# Patient Record
Sex: Female | Born: 1980 | Race: Black or African American | Hispanic: No | Marital: Single | Smoking: Never smoker
Health system: Southern US, Community
[De-identification: ages and names within clinical notes are randomized; demographics above are authoritative.]

## PROBLEM LIST (undated history)

## (undated) DIAGNOSIS — I10 Essential (primary) hypertension: Secondary | ICD-10-CM

## (undated) DIAGNOSIS — G43909 Migraine, unspecified, not intractable, without status migrainosus: Secondary | ICD-10-CM

## (undated) DIAGNOSIS — F32A Depression, unspecified: Secondary | ICD-10-CM

## (undated) DIAGNOSIS — E119 Type 2 diabetes mellitus without complications: Secondary | ICD-10-CM

## (undated) DIAGNOSIS — K589 Irritable bowel syndrome without diarrhea: Secondary | ICD-10-CM

## (undated) DIAGNOSIS — M222X9 Patellofemoral disorders, unspecified knee: Secondary | ICD-10-CM

## (undated) DIAGNOSIS — F419 Anxiety disorder, unspecified: Secondary | ICD-10-CM

## (undated) DIAGNOSIS — K8020 Calculus of gallbladder without cholecystitis without obstruction: Secondary | ICD-10-CM

## (undated) DIAGNOSIS — N760 Acute vaginitis: Secondary | ICD-10-CM

## (undated) DIAGNOSIS — B9689 Other specified bacterial agents as the cause of diseases classified elsewhere: Secondary | ICD-10-CM

## (undated) DIAGNOSIS — D649 Anemia, unspecified: Secondary | ICD-10-CM

## (undated) HISTORY — DX: Calculus of gallbladder without cholecystitis without obstruction: K80.20

## (undated) HISTORY — PX: CHOLECYSTECTOMY: SHX55

## (undated) HISTORY — DX: Anxiety disorder, unspecified: F41.9

## (undated) HISTORY — DX: Morbid (severe) obesity due to excess calories: E66.01

## (undated) HISTORY — DX: Migraine, unspecified, not intractable, without status migrainosus: G43.909

## (undated) HISTORY — DX: Essential (primary) hypertension: I10

## (undated) HISTORY — DX: Acute vaginitis: N76.0

## (undated) HISTORY — DX: Type 2 diabetes mellitus without complications: E11.9

## (undated) HISTORY — DX: Depression, unspecified: F32.A

## (undated) HISTORY — DX: Patellofemoral disorders, unspecified knee: M22.2X9

## (undated) HISTORY — DX: Irritable bowel syndrome, unspecified: K58.9

## (undated) HISTORY — DX: Anemia, unspecified: D64.9

## (undated) HISTORY — DX: Other specified bacterial agents as the cause of diseases classified elsewhere: B96.89

---

## 2003-06-12 HISTORY — PX: COLONOSCOPY: SHX174

## 2013-08-11 ENCOUNTER — Encounter (HOSPITAL_COMMUNITY): Payer: Self-pay | Admitting: Family Medicine

## 2013-08-11 ENCOUNTER — Emergency Department (HOSPITAL_COMMUNITY)
Admission: EM | Admit: 2013-08-11 | Discharge: 2013-08-11 | Disposition: A | Discharge status: Home / Self Care | Payer: Medicaid Other | Source: Home / Self Care

## 2013-08-11 DIAGNOSIS — H6990 Unspecified Eustachian tube disorder, unspecified ear: Secondary | ICD-10-CM

## 2013-08-11 DIAGNOSIS — J069 Acute upper respiratory infection, unspecified: Secondary | ICD-10-CM

## 2013-08-11 DIAGNOSIS — H6980 Other specified disorders of Eustachian tube, unspecified ear: Secondary | ICD-10-CM

## 2013-08-11 DIAGNOSIS — B9789 Other viral agents as the cause of diseases classified elsewhere: Principal | ICD-10-CM

## 2013-08-11 LAB — POCT RAPID STREP A: STREPTOCOCCUS, GROUP A SCREEN (DIRECT): NEGATIVE

## 2013-08-11 MED ORDER — IPRATROPIUM BROMIDE 0.06 % NA SOLN: 2.0000 | Freq: Four times a day (QID) | NASAL | Status: DC

## 2013-08-11 MED ORDER — ANTIPYRINE-BENZOCAINE 5.4-1.4 % OT SOLN: 3.0000 [drp] | OTIC | Status: DC | PRN

## 2013-08-11 NOTE — Discharge Instructions (Signed)
You are doing well overall and likely have a viral illness causing your symptoms. If you do not get better in a total of 7-10 days, or if you get worse let us know as you may have developed a secondary bacterial infection Please start on the atrovent, Mucinex D, and ibuprofen 600mg  every 6 hours for your symptoms Please start on the auralgan for the ear pain/fullness.

## 2013-08-11 NOTE — ED Provider Notes (Signed)
CSN: [id number]     Arrival date & time 08/11/13  1843 History   None    No chief complaint on file.  (Consider location/radiation/quality/duration/timing/severity/associated sxs/prior Treatment) HPI  Sore throat: started on Saturday. Partied on Friday. H/o 2-3 lifetime strep throat infections. White spots on tonsils. Associated w/ cough adn runny nose. Denies body aches, fever, n/v/d. Getting better. Has not taken any medications yet. Pain is described as irritation.   L ear pain: started 1 hr ago. Achy pain. Difficulty hearing. Full sensation.   History reviewed. No pertinent past medical history. Past Surgical History  Procedure Laterality Date  . Cholecystectomy     Family History  Problem Relation Age of Onset  . Diabetes Mother   . Hypertension Mother   . Diabetes Father   . Hypertension Father   . Kidney failure Father    History  Substance Use Topics  . Smoking status: Never Smoker   . Smokeless tobacco: Not on file  . Alcohol Use: Yes   OB History   Grav Para Term Preterm Abortions TAB SAB Ect Mult Living                 Review of Systems  Constitutional: Negative for fever, chills and fatigue.  HENT: Positive for congestion.   All other systems reviewed and are negative.    Allergies  Review of patient's allergies indicates not on file.  Home Medications   Current Outpatient Rx  Name  Route  Sig  Dispense  Refill  . antipyrine-benzocaine (AURALGAN) otic solution   Both Ears   Place 3-4 drops into both ears every 2 (two) hours as needed for ear pain.   10 mL   0   . ipratropium (ATROVENT) 0.06 % nasal spray   Each Nare   Place 2 sprays into both nostrils 4 (four) times daily.   15 mL   12    BP 173/78  Pulse 69  Temp(Src) 98.6 F (37 C) (Oral)  Resp 16  SpO2 100% Physical Exam  Constitutional: She is oriented to person, place, and time. She appears well-developed and well-nourished. No distress.  HENT:  TM bilat w/ clear effusions   Eyes: EOM are normal. Pupils are equal, round, and reactive to light.  Neck: Normal range of motion. Neck supple.  Cardiovascular: Normal rate, regular rhythm, normal heart sounds and intact distal pulses.  Exam reveals no gallop and no friction rub.   No murmur heard. Pulmonary/Chest: Effort normal and breath sounds normal. No respiratory distress. She has no wheezes. She has no rales. She exhibits no tenderness.  Abdominal: Soft. She exhibits no distension.  Musculoskeletal: Normal range of motion. She exhibits no edema and no tenderness.  Lymphadenopathy:    She has no cervical adenopathy.  Neurological: She is alert and oriented to person, place, and time.  Skin: Skin is warm. No rash noted. She is not diaphoretic.  Psychiatric: Her behavior is normal. Judgment and thought content normal.    ED Course  Procedures (including critical care time) Labs Review Labs Reviewed  POCT RAPID STREP A ([facility] CARE ONLY)   Imaging Review No results found.   MDM   1. Viral URI with cough   2. Eustachian tube dysfunction    33yo F w/ lkely viral URI and eustachian tube dysfunction.  - auralgan for ear fullness. Also anticipate improvement w/ NSAID use.  - consider flonase if not improving - start atrovent, mucinex D, and Ibuprofen 600mg  every 6 hours -  precautions given and all questions answered - pt to return if not improveing or worsening after a total of 7-10 days of illness  Heidi Flatten, MD Family Medicine PGY-3 08/11/2013, 9:32 PM      Heidi Rocks, MD 08/11/13 2132

## 2013-08-11 NOTE — ED Notes (Signed)
Patient complains of sore throat that started 3 days ago.

## 2013-08-11 NOTE — ED Provider Notes (Signed)
Medical screening examination/treatment/procedure(s) were performed by a resident physician and as supervising physician I was immediately available for consultation/collaboration.  Leslee Homeavid Cystal Shannahan, M.D.  Reuben Likesavid C Nekisha Mcdiarmid, MD 08/11/13 [phone]

## 2013-08-13 LAB — CULTURE, GROUP A STREP

## 2014-06-24 ENCOUNTER — Encounter: Payer: Self-pay | Admitting: Dietician

## 2014-06-24 ENCOUNTER — Encounter: Payer: Medicaid Other | Attending: Family Medicine | Admitting: Dietician

## 2014-06-24 DIAGNOSIS — Z6841 Body Mass Index (BMI) 40.0 and over, adult: Secondary | ICD-10-CM | POA: Insufficient documentation

## 2014-06-24 NOTE — Progress Notes (Signed)
  Medical Nutrition Therapy:  Appt start time: 0930 end time:  1030.  Assessment:  Primary concerns today: Natalia LeatherwoodKatherine is here to discuss weight loss and overall health. She would like to focus on lifestyle changes and not a "diet." Has lost weight in the past by following a strict meal plan and dancing on Wii Fit. She reports that she has a watery bowel movement almost every time she eats. Sometimes has to go the bathroom before she even finishes her meal. Had a colonoscopy and endoscopy years ago and results were normal. In the past has kept a detailed food journal. Her frequent, loose bowel movements have gotten worse since her cholecystectomy. Has noticed that high fat foods and lactose make symptoms worse.  Immodium helps some. This has been happening for over 10 years. She uses public transportation and [employer] benefits. HgbA1c 6%. The family does not fry foods. She shares cooking and grocery shopping responsibilities with family members. Would like to store food for herself and her 662 year old son in the basement in the home where they live. She cares for her ill mother.   Preferred Learning Style:   No preference indicated   Learning Readiness:   Ready   MEDICATIONS: see list   DIETARY INTAKE:   24-hr recall:  B (7:40 AM): Lucky Charms or Captain Crunch cereal with 2% Lactaid milk OR sausage or gravy biscuit from [employer]  Snk ( AM):   L ( PM): Ramen noodles or leftovers  Snk ( PM):  D ( PM): baked ribs, pork chops, ground beef, Malaw[place]key necks, chicken with canned vegetables Snk ( PM):   Beverages: juice, water  Usual physical activity: dancing with Wii  Estimated energy needs: 2000-2200 calories 225-248 g carbohydrates  Progress Towards Goal(s):  In progress.   Nutritional Diagnosis:  Ivor-3.3 Overweight/obesity As related to excessive energy intake, inappropriate food choices, and physical inactivity.  As evidenced by BMI 55 and dietary recall.    Intervention:  Nutrition  counseling provided. Discussed meal and snack ideas on a budget. Encouraged patient to limit carbohydrates and salt. Goals: -Fill up on non starchy vegetables (limit corn, peas, and potatoes) -Try rinsing canned vegetables or use frozen vegetables  -Continue to avoid skipping meals -Have a protein food with every meal and snack -Try making a lot of food ahead of time (leftovers!) -Keep dancing and walking for exercise!!  Teaching Method Utilized:  Visual Auditory  Handouts given during visit include:  Diabetes sample meal plan  Meal planning card  MyPlate  Snack sheet  Barriers to learning/adherence to lifestyle change: limited financial resources   Demonstrated degree of understanding via:  Teach Back   Monitoring/Evaluation:  Dietary intake, exercise, and body weight in 3 month(s).

## 2014-06-24 NOTE — Patient Instructions (Addendum)
-  Fill up on non starchy vegetables (limit corn, peas, and potatoes) -Try rinsing canned vegetables or use frozen vegetables   -Continue to avoid skipping meals -Have a protein food with every meal and snack  -Try making a lot of food ahead of time (leftovers!)  -Keep dancing and walking for exercise!!

## 2014-09-23 ENCOUNTER — Ambulatory Visit: Payer: Medicaid Other | Admitting: *Deleted

## 2014-10-21 ENCOUNTER — Ambulatory Visit: Payer: Medicaid Other | Admitting: *Deleted

## 2014-10-26 ENCOUNTER — Encounter: Payer: Self-pay | Admitting: Gastroenterology

## 2014-12-06 ENCOUNTER — Ambulatory Visit: Payer: Medicaid Other | Admitting: *Deleted

## 2015-01-03 ENCOUNTER — Ambulatory Visit: Payer: Medicaid Other | Admitting: Gastroenterology

## 2015-03-08 ENCOUNTER — Encounter: Payer: Self-pay | Admitting: Gastroenterology

## 2015-03-08 ENCOUNTER — Ambulatory Visit (INDEPENDENT_AMBULATORY_CARE_PROVIDER_SITE_OTHER): Payer: Medicaid Other | Admitting: Gastroenterology

## 2015-03-08 ENCOUNTER — Other Ambulatory Visit (INDEPENDENT_AMBULATORY_CARE_PROVIDER_SITE_OTHER): Payer: Medicaid Other

## 2015-03-08 VITALS — BP 112/72 | HR 80 | Ht 65.0 in | Wt 331.6 lb

## 2015-03-08 DIAGNOSIS — K589 Irritable bowel syndrome without diarrhea: Secondary | ICD-10-CM | POA: Diagnosis not present

## 2015-03-08 DIAGNOSIS — R197 Diarrhea, unspecified: Secondary | ICD-10-CM

## 2015-03-08 LAB — CBC WITH DIFFERENTIAL/PLATELET
BASOS ABS: 0 10*3/uL (ref 0.0–0.1)
Basophils Relative: 0.6 % (ref 0.0–3.0)
Eosinophils Absolute: 0.1 10*3/uL (ref 0.0–0.7)
Eosinophils Relative: 1.8 % (ref 0.0–5.0)
HCT: 35.2 % — ABNORMAL LOW (ref 36.0–46.0)
Hemoglobin: 11.6 g/dL — ABNORMAL LOW (ref 12.0–15.0)
LYMPHS ABS: 2.8 10*3/uL (ref 0.7–4.0)
Lymphocytes Relative: 47.1 % — ABNORMAL HIGH (ref 12.0–46.0)
MCHC: 33 g/dL (ref 30.0–36.0)
MCV: 81.2 fl (ref 78.0–100.0)
MONO ABS: 0.6 10*3/uL (ref 0.1–1.0)
Monocytes Relative: 9.4 % (ref 3.0–12.0)
NEUTROS PCT: 41.1 % — AB (ref 43.0–77.0)
Neutro Abs: 2.5 10*3/uL (ref 1.4–7.7)
Platelets: 367 10*3/uL (ref 150.0–400.0)
RBC: 4.34 Mil/uL (ref 3.87–5.11)
RDW: 15 % (ref 11.5–15.5)
WBC: 6 10*3/uL (ref 4.0–10.5)

## 2015-03-08 LAB — COMPREHENSIVE METABOLIC PANEL
ALT: 17 U/L (ref 0–35)
AST: 17 U/L (ref 0–37)
Albumin: 3.8 g/dL (ref 3.5–5.2)
Alkaline Phosphatase: 83 U/L (ref 39–117)
BUN: 11 mg/dL (ref 6–23)
CO2: 23 meq/L (ref 19–32)
Calcium: 9.6 mg/dL (ref 8.4–10.5)
Chloride: 106 mEq/L (ref 96–112)
Creatinine, Ser: 0.83 mg/dL (ref 0.40–1.20)
GFR: 100.24 mL/min (ref >60.00)
GLUCOSE: 119 mg/dL — AB (ref 70–99)
POTASSIUM: 3.9 meq/L (ref 3.5–5.1)
SODIUM: 140 meq/L (ref 135–145)
Total Bilirubin: 0.3 mg/dL (ref 0.2–1.2)
Total Protein: 8.3 g/dL (ref 6.0–8.3)

## 2015-03-08 LAB — TSH: TSH: 1.19 u[IU]/mL (ref 0.35–4.50)

## 2015-03-08 LAB — IGA: IgA: 219 mg/dL (ref 68–378)

## 2015-03-08 MED ORDER — CHOLESTYRAMINE 4 G PO PACK
4.0000 g | PACK | Freq: Every day | ORAL | Status: DC
Start: 2015-03-08 — End: 2017-05-29

## 2015-03-08 NOTE — Addendum Note (Signed)
Addended by: Richardson Chiquito on: 03/08/2015 09:58 AM   Modules accepted: Medications

## 2015-03-08 NOTE — Progress Notes (Signed)
HPI: This is a  very pleasant 34 year old woman    who was referred to me by Powers, Shireen Quan, MD  to evaluate  chronic was prandial diarrhea .    Chief complaint is cramping, diarrhea  She has had loose stool for 10 years.  Loose stools after every meal.  She isn't sure if any particular foods   She had colonoscopy and upper endoscopy in [facility] 2005. She understands they were both normal, including normal biopsies of the colon  GB removed in 2010.    Crampy pains after eating, then loose stools, will have 3-4 BMs per day. Worse with greasy.  Doesn't seem like dairy is related per her diet recollections. Cheese actually constipated.  She takes 1 imodium PRN for social event  Overall her weight is decreasing intentionally, down 5-7 pounds.  Will drink 2 caffinated bevs per day.  Coffee occasionally.  Review of systems: Pertinent positive and negative review of systems were noted in the above HPI section. Complete review of systems was performed and was otherwise normal.   Past Medical History  Diagnosis Date  . Migraine   . PFS (patellofemoral syndrome)     right  . BV (bacterial vaginosis)   . Anxiety   . Obesity, morbid, BMI 50 or higher     Past Surgical History  Procedure Laterality Date  . Cholecystectomy      Current Outpatient Prescriptions  Medication Sig Dispense Refill  . antipyrine-benzocaine (AURALGAN) otic solution Place 3-4 drops into both ears every 2 (two) hours as needed for ear pain. 10 mL 0  . baclofen (LIORESAL) 10 MG tablet Take 10 mg by mouth 3 (three) times daily.    . busPIRone (BUSPAR) 15 MG tablet Take 15 mg by mouth 3 (three) times daily.    . fluocinonide cream (LIDEX) 0.05 % Apply 1 application topically 2 (two) times daily.    Marland Kitchen ipratropium (ATROVENT) 0.06 % nasal spray Place 2 sprays into both nostrils 4 (four) times daily. 15 mL 12  . ketoconazole (NIZORAL) 2 % cream Apply 1 application topically daily.    Marland Kitchen levonorgestrel (MIRENA)  20 MCG/24HR IUD 1 each by Intrauterine route once.    . metFORMIN (GLUCOPHAGE) 500 MG tablet Take by mouth 2 (two) times daily with a meal.    . topiramate (TOPAMAX) 100 MG tablet Take 100 mg by mouth 2 (two) times daily.     No current facility-administered medications for this visit.    Allergies as of 03/08/2015  . (No Known Allergies)    Family History  Problem Relation Age of Onset  . Diabetes Mother   . Hypertension Mother   . Diabetes Father   . Hypertension Father   . Kidney failure Father   . Endometrial cancer Mother   . Colon cancer Neg Hx   . Congestive Heart Failure Mother   . Esophageal cancer Neg Hx   . Pancreatic cancer Neg Hx   . Liver disease Neg Hx     Social History   Social History  . Marital Status: Single    Spouse Name: N/A  . Number of Children: N/A  . Years of Education: N/A   Occupational History  . Not on file.   Social History Main Topics  . Smoking status: Never Smoker   . Smokeless tobacco: Never Used  . Alcohol Use: 0.0 oz/week    0 Standard drinks or equivalent per week     Comment: seldom  . Drug Use: No  .  Sexual Activity: Not on file   Other Topics Concern  . Not on file   Social History Narrative     Physical Exam: BP 112/72 mmHg  Pulse 80  Ht  (1.651 m)  Wt 331 lb 9.6 oz (150.413 kg)  BMI 55.18 kg/m2 Constitutional: generally well-appearing Psychiatric: alert and oriented x3 Eyes: extraocular movements intact Mouth: oral pharynx moist, no lesions Neck: supple no lymphadenopathy Cardiovascular: heart regular rate and rhythm Lungs: clear to auscultation bilaterally Abdomen: soft, nontender, nondistended, no obvious ascites, no peritoneal signs, normal bowel sounds Extremities: no lower extremity edema bilaterally Skin: no lesions on visible extremities   Assessment and plan: 34 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 She will get a basic set of labs today and stool testing checked for chronic infection, thyroid issues, celiac sprue all of which I doubt. She has had her gallbladder removed and so my first suggestion is trial of cholestyramine powder 4 g once daily. She will call to report on her response in 5-6 weeks.   Rob Bunting, MD [facility] 03/08/2015, 9:35 AM  Cc: Ihor Gully, MD

## 2015-03-08 NOTE — Patient Instructions (Addendum)
Start trial of cholestyramine.   Call in 5-6 weeks to report on your response. You will have labs checked today in the basement lab.  Please head down after you check out with the front desk  ( tsh, cbc, cmet, IgA TTG, stool testing for ova/parasites, giardia stool antigen, stool fecal leuks).

## 2015-03-08 NOTE — Progress Notes (Deleted)
HPI: This is a  ***  who was referred to me by Powers, Shireen Quan, MD  to evaluate  *** .    Chief complaint is ***  Review of systems: Pertinent positive and negative review of systems were noted in the above HPI section. Complete review of systems was performed and was otherwise normal.   Past Medical History  Diagnosis Date  . Obesity   . Migraine   . PFS (patellofemoral syndrome)     right  . BV (bacterial vaginosis)   . Anxiety     Past Surgical History  Procedure Laterality Date  . Cholecystectomy      Current Outpatient Prescriptions  Medication Sig Dispense Refill  . antipyrine-benzocaine (AURALGAN) otic solution Place 3-4 drops into both ears every 2 (two) hours as needed for ear pain. 10 mL 0  . baclofen (LIORESAL) 10 MG tablet Take 10 mg by mouth 3 (three) times daily.    . busPIRone (BUSPAR) 15 MG tablet Take 15 mg by mouth 3 (three) times daily.    . fluocinonide cream (LIDEX) 0.05 % Apply 1 application topically 2 (two) times daily.    Marland Kitchen ipratropium (ATROVENT) 0.06 % nasal spray Place 2 sprays into both nostrils 4 (four) times daily. 15 mL 12  . ketoconazole (NIZORAL) 2 % cream Apply 1 application topically daily.    Marland Kitchen levonorgestrel (MIRENA) 20 MCG/24HR IUD 1 each by Intrauterine route once.    . metFORMIN (GLUCOPHAGE) 500 MG tablet Take by mouth 2 (two) times daily with a meal.    . topiramate (TOPAMAX) 100 MG tablet Take 100 mg by mouth 2 (two) times daily.     No current facility-administered medications for this visit.    Allergies as of 03/08/2015  . (No Known Allergies)    Family History  Problem Relation Age of Onset  . Diabetes Mother   . Hypertension Mother   . Diabetes Father   . Hypertension Father   . Kidney failure Father   . Endometrial cancer Mother   . Colon cancer Neg Hx   . Congestive Heart Failure Mother   . Esophageal cancer Neg Hx   . Pancreatic cancer Neg Hx   . Liver disease Neg Hx     Social History   Social History   . Marital Status: Single    Spouse Name: N/A  . Number of Children: N/A  . Years of Education: N/A   Occupational History  . Not on file.   Social History Main Topics  . Smoking status: Never Smoker   . Smokeless tobacco: Never Used  . Alcohol Use: 0.0 oz/week    0 Standard drinks or equivalent per week     Comment: seldom  . Drug Use: No  . Sexual Activity: Not on file   Other Topics Concern  . Not on file   Social History Narrative     Physical Exam: BP 112/72 mmHg  Pulse 80  Ht  (1.651 m)  Wt 331 lb 9.6 oz (150.413 kg)  BMI 55.18 kg/m2 Constitutional: generally well-appearing Psychiatric: alert and oriented x3 Eyes: extraocular movements intact Mouth: oral pharynx moist, no lesions Neck: supple no lymphadenopathy Cardiovascular: heart regular rate and rhythm Lungs: clear to auscultation bilaterally Abdomen: soft, nontender, nondistended, no obvious ascites, no peritoneal signs, normal bowel sounds Extremities: no lower extremity edema bilaterally Skin: no lesions on visible extremities   Assessment and plan: 34 y.o. female with  ***  ***   Rob Bunting, MD [facility] 03/08/2015, 9:33 AM  Cc: Ihor Gully, MD

## 2015-03-09 LAB — TISSUE TRANSGLUTAMINASE, IGA: Tissue Transglutaminase Ab, IgA: 1 U/mL (ref <4)

## 2016-10-29 ENCOUNTER — Ambulatory Visit: Payer: Medicaid Other | Admitting: Obstetrics and Gynecology

## 2017-05-29 ENCOUNTER — Ambulatory Visit: Payer: Medicaid Other | Admitting: Obstetrics and Gynecology

## 2017-05-29 ENCOUNTER — Other Ambulatory Visit (HOSPITAL_COMMUNITY)
Admission: RE | Admit: 2017-05-29 | Discharge: 2017-05-29 | Disposition: A | Discharge status: Home / Self Care | Payer: Medicaid Other | Source: Ambulatory Visit | Attending: Obstetrics and Gynecology | Admitting: Obstetrics and Gynecology

## 2017-05-29 ENCOUNTER — Encounter: Payer: Self-pay | Admitting: Obstetrics and Gynecology

## 2017-05-29 VITALS — BP 163/91 | HR 85 | Ht 65.0 in | Wt 338.0 lb

## 2017-05-29 DIAGNOSIS — Z30432 Encounter for removal of intrauterine contraceptive device: Secondary | ICD-10-CM | POA: Insufficient documentation

## 2017-05-29 DIAGNOSIS — G43909 Migraine, unspecified, not intractable, without status migrainosus: Secondary | ICD-10-CM | POA: Diagnosis not present

## 2017-05-29 DIAGNOSIS — Z01419 Encounter for gynecological examination (general) (routine) without abnormal findings: Secondary | ICD-10-CM | POA: Insufficient documentation

## 2017-05-29 DIAGNOSIS — F419 Anxiety disorder, unspecified: Secondary | ICD-10-CM | POA: Insufficient documentation

## 2017-05-29 DIAGNOSIS — Z0000 Encounter for general adult medical examination without abnormal findings: Secondary | ICD-10-CM | POA: Diagnosis not present

## 2017-05-29 DIAGNOSIS — Z6841 Body Mass Index (BMI) 40.0 and over, adult: Secondary | ICD-10-CM | POA: Diagnosis not present

## 2017-05-29 NOTE — Patient Instructions (Signed)
Preventive Care 18-39 Years, Female Preventive care refers to lifestyle choices and visits with your health care provider that can promote health and wellness. What does preventive care include?  A yearly physical exam. This is also called an annual well check.  Dental exams once or twice a year.  Routine eye exams. Ask your health care provider how often you should have your eyes checked.  Personal lifestyle choices, including: ? Daily care of your teeth and gums. ? Regular physical activity. ? Eating a healthy diet. ? Avoiding tobacco and drug use. ? Limiting alcohol use. ? Practicing safe sex. ? Taking vitamin and mineral supplements as recommended by your health care provider. What happens during an annual well check? The services and screenings done by your health care provider during your annual well check will depend on your age, overall health, lifestyle risk factors, and family history of disease. Counseling Your health care provider may ask you questions about your:  Alcohol use.  Tobacco use.  Drug use.  Emotional well-being.  Home and relationship well-being.  Sexual activity.  Eating habits.  Work and work Statistician.  Method of birth control.  Menstrual cycle.  Pregnancy history.  Screening You may have the following tests or measurements:  Height, weight, and BMI.  Diabetes screening. This is done by checking your blood sugar (glucose) after you have not eaten for a while (fasting).  Blood pressure.  Lipid and cholesterol levels. These may be checked every 5 years starting at age 66.  Skin check.  Hepatitis C blood test.  Hepatitis B blood test.  Sexually transmitted disease (STD) testing.  BRCA-related cancer screening. This may be done if you have a family history of breast, ovarian, tubal, or peritoneal cancers.  Pelvic exam and Pap test. This may be done every 3 years starting at age 40. Starting at age 59, this may be done every 5  years if you have a Pap test in combination with an HPV test.  Discuss your test results, treatment options, and if necessary, the need for more tests with your health care provider. Vaccines Your health care provider may recommend certain vaccines, such as:  Influenza vaccine. This is recommended every year.  Tetanus, diphtheria, and acellular pertussis (Tdap, Td) vaccine. You may need a Td booster every 10 years.  Varicella vaccine. You may need this if you have not been vaccinated.  HPV vaccine. If you are 69 or younger, you may need three doses over 6 months.  Measles, mumps, and rubella (MMR) vaccine. You may need at least one dose of MMR. You may also need a second dose.  Pneumococcal 13-valent conjugate (PCV13) vaccine. You may need this if you have certain conditions and were not previously vaccinated.  Pneumococcal polysaccharide (PPSV23) vaccine. You may need one or two doses if you smoke cigarettes or if you have certain conditions.  Meningococcal vaccine. One dose is recommended if you are age 27-21 years and a first-year college student living in a residence hall, or if you have one of several medical conditions. You may also need additional booster doses.  Hepatitis A vaccine. You may need this if you have certain conditions or if you travel or work in places where you may be exposed to hepatitis A.  Hepatitis B vaccine. You may need this if you have certain conditions or if you travel or work in places where you may be exposed to hepatitis B.  Haemophilus influenzae type b (Hib) vaccine. You may need this if  you have certain risk factors.  Talk to your health care provider about which screenings and vaccines you need and how often you need them. This information is not intended to replace advice given to you by your health care provider. Make sure you discuss any questions you have with your health care provider. Document Released: 07/24/2001 Document Revised: 02/15/2016  Document Reviewed: 03/29/2015 Elsevier Interactive Patient Education  [employer].

## 2017-05-29 NOTE — Progress Notes (Signed)
Subjective:     Heidi Berry is a 36 y.o. female  with BMI 56 who is here for a comprehensive physical exam. The patient reports no problems. She is not sexually active. She has been using Mirena for cycle control. She describes her period as 5-day period with Mirena IUD. She denies any pelvic pain or abnormal discharge. Her Mirena is due for removal and she desires to stay with hormonal contraception to evaluate her cycles  Past Medical History:  Diagnosis Date  . Anxiety   . BV (bacterial vaginosis)   . Migraine   . Obesity, morbid, BMI 50 or higher   . PFS (patellofemoral syndrome)    right   Past Surgical History:  Procedure Laterality Date  . CHOLECYSTECTOMY     Family History  Problem Relation Age of Onset  . Diabetes Mother   . Hypertension Mother   . Diabetes Father   . Hypertension Father   . Kidney failure Father   . Endometrial cancer Mother   . Colon cancer Neg Hx   . Congestive Heart Failure Mother   . Esophageal cancer Neg Hx   . Pancreatic cancer Neg Hx   . Liver disease Neg Hx     Social History   Socioeconomic History  . Marital status: Single    Spouse name: Not on file  . Number of children: Not on file  . Years of education: Not on file  . Highest education level: Not on file  Social Needs  . Financial resource strain: Not on file  . Food insecurity - worry: Not on file  . Food insecurity - inability: Not on file  . Transportation needs - medical: Not on file  . Transportation needs - non-medical: Not on file  Occupational History  . Not on file  Tobacco Use  . Smoking status: Never Smoker  . Smokeless tobacco: Never Used  Substance and Sexual Activity  . Alcohol use: Yes    Alcohol/week: 0.0 oz    Comment: seldom  . Drug use: No  . Sexual activity: Not on file  Other Topics Concern  . Not on file  Social History Narrative  . Not on file   Health Maintenance  Topic Date Due  . HIV Screening  07/18/1994  . TETANUS/TDAP  07/18/1998   . PAP SMEAR  07/18/2000  . INFLUENZA VACCINE  01/09/2017       Review of Systems Pertinent items are noted in HPI.   Objective:  Blood pressure (!) 163/91, pulse 85, height 5\' 5"  (1.651 m), weight (!) 338 lb (153.3 kg), last menstrual period 03/15/2017.     GENERAL: Well-developed, well-nourished female in no acute distress.  HEENT: Normocephalic, atraumatic. Sclerae anicteric.  NECK: Supple. Normal thyroid.  LUNGS: Clear to auscultation bilaterally.  HEART: Regular rate and rhythm. BREASTS: Symmetric in size. No palpable masses or lymphadenopathy, skin changes, or nipple drainage. ABDOMEN: Soft, nontender, nondistended. No organomegaly. Obese PELVIC: Normal external female genitalia. Vagina is pink and rugated.  Normal discharge. Normal appearing cervix with IUD strings visualized at the os. Bimanual exam limited due to body habitus EXTREMITIES: No cyanosis, clubbing, or edema, 2+ distal pulses.    Assessment:    Healthy female exam.      Plan:    Pap smear collected along with cultures Patient with known HTN and did not take her medication today.  Patient encouraged to perform self breast and vulva exam Patient instructed to keep a menstrual calendar and to return with issues  Patient will be contacted with abnormal results IUD Removal  Patient identified, informed consent performed, consent signed.  Patient was in the dorsal lithotomy position, normal external genitalia was noted.  A speculum was placed in the patient's vagina, normal discharge was noted, no lesions. The cervix was visualized, no lesions, no abnormal discharge.  The strings of the IUD were grasped and pulled using ring forceps. The IUD was removed in its entirety. Patient tolerated the procedure well.    RTC in 1 year or PRN See After Visit Summary for Counseling Recommendations

## 2017-05-30 LAB — COMPREHENSIVE METABOLIC PANEL
ALBUMIN: 4 g/dL (ref 3.5–5.5)
ALT: 17 IU/L (ref 0–32)
AST: 20 IU/L (ref 0–40)
Albumin/Globulin Ratio: 1 — ABNORMAL LOW (ref 1.2–2.2)
Alkaline Phosphatase: 89 IU/L (ref 39–117)
BUN / CREAT RATIO: 11 (ref 9–23)
BUN: 9 mg/dL (ref 6–20)
Bilirubin Total: 0.2 mg/dL (ref 0.0–1.2)
CALCIUM: 9.3 mg/dL (ref 8.7–10.2)
CO2: 20 mmol/L (ref 20–29)
Chloride: 107 mmol/L — ABNORMAL HIGH (ref 96–106)
Creatinine, Ser: 0.8 mg/dL (ref 0.57–1.00)
GFR calc Af Amer: 109 mL/min/{1.73_m2} (ref >59)
GFR calc non Af Amer: 94 mL/min/{1.73_m2} (ref >59)
GLOBULIN, TOTAL: 4 g/dL (ref 1.5–4.5)
Glucose: 123 mg/dL — ABNORMAL HIGH (ref 65–99)
Potassium: 4 mmol/L (ref 3.5–5.2)
SODIUM: 141 mmol/L (ref 134–144)
Total Protein: 8 g/dL (ref 6.0–8.5)

## 2017-05-30 LAB — CBC
HEMATOCRIT: 33 % — AB (ref 34.0–46.6)
HEMOGLOBIN: 11.2 g/dL (ref 11.1–15.9)
MCH: 26.9 pg (ref 26.6–33.0)
MCHC: 33.9 g/dL (ref 31.5–35.7)
MCV: 79 fL (ref 79–97)
Platelets: 383 10*3/uL — ABNORMAL HIGH (ref 150–379)
RBC: 4.16 x10E6/uL (ref 3.77–5.28)
RDW: 14.7 % (ref 12.3–15.4)
WBC: 6.2 10*3/uL (ref 3.4–10.8)

## 2017-05-30 LAB — CERVICOVAGINAL ANCILLARY ONLY
Bacterial vaginitis: NEGATIVE
CANDIDA VAGINITIS: NEGATIVE
CHLAMYDIA, DNA PROBE: NEGATIVE
NEISSERIA GONORRHEA: NEGATIVE
TRICH (WINDOWPATH): NEGATIVE

## 2017-05-30 LAB — HEMOGLOBIN A1C
Est. average glucose Bld gHb Est-mCnc: 154 mg/dL
Hgb A1c MFr Bld: 7 % — ABNORMAL HIGH (ref 4.8–5.6)

## 2017-05-30 LAB — TSH: TSH: 1.82 u[IU]/mL (ref 0.450–4.500)

## 2017-05-31 LAB — CYTOLOGY - PAP
DIAGNOSIS: NEGATIVE
HPV (WINDOPATH): NOT DETECTED

## 2017-06-05 ENCOUNTER — Encounter: Payer: Self-pay | Admitting: *Deleted

## 2019-07-01 ENCOUNTER — Encounter: Payer: Self-pay | Admitting: Cardiology

## 2020-02-02 ENCOUNTER — Other Ambulatory Visit: Payer: Self-pay | Admitting: Physician Assistant

## 2020-02-02 DIAGNOSIS — U071 COVID-19: Secondary | ICD-10-CM

## 2020-02-02 DIAGNOSIS — I10 Essential (primary) hypertension: Secondary | ICD-10-CM

## 2020-02-02 DIAGNOSIS — E119 Type 2 diabetes mellitus without complications: Secondary | ICD-10-CM

## 2020-02-02 NOTE — Progress Notes (Signed)
I connected by phone with Heidi Berry on 02/02/2020 at 2:59 PM to discuss the potential use of a new treatment for mild to moderate COVID-19 viral infection in non-hospitalized patients.  This patient is a 39 y.o. female that meets the FDA criteria for Emergency Use Authorization of COVID monoclonal antibody casirivimab/imdevimab.  Has a (+) direct SARS-CoV-2 viral test result  Has mild or moderate COVID-19   Is NOT hospitalized due to COVID-19  Is within 10 days of symptom onset  Has at least one of the high risk factor(s) for progression to severe COVID-19 and/or hospitalization as defined in EUA.  Specific high risk criteria : BMI > 25, Diabetes and Cardiovascular disease or hypertension   I have spoken and communicated the following to the patient or parent/caregiver regarding COVID monoclonal antibody treatment:  1. FDA has authorized the emergency use for the treatment of mild to moderate COVID-19 in adults and pediatric patients with positive results of direct SARS-CoV-2 viral testing who are 48 years of age and older weighing at least 40 kg, and who are at high risk for progressing to severe COVID-19 and/or hospitalization.  2. The significant known and potential risks and benefits of COVID monoclonal antibody, and the extent to which such potential risks and benefits are unknown.  3. Information on available alternative treatments and the risks and benefits of those alternatives, including clinical trials.  4. Patients treated with COVID monoclonal antibody should continue to self-isolate and use infection control measures (e.g., wear mask, isolate, social distance, avoid sharing personal items, clean and disinfect "high touch" surfaces, and frequent handwashing) according to CDC guidelines.   5. The patient or parent/caregiver has the option to accept or refuse COVID monoclonal antibody treatment.  After reviewing this information with the patient, The patient agreed to  proceed with receiving casirivimab\imdevimab infusion and will be provided a copy of the Fact sheet prior to receiving the infusion. [name] 02/02/2020 2:59 PM

## 2020-02-03 ENCOUNTER — Ambulatory Visit (HOSPITAL_COMMUNITY)
Admission: RE | Admit: 2020-02-03 | Discharge: 2020-02-03 | Disposition: A | Discharge status: Home / Self Care | Payer: Medicaid Other | Source: Ambulatory Visit | Attending: Pulmonary Disease | Admitting: Pulmonary Disease

## 2020-02-03 ENCOUNTER — Ambulatory Visit (HOSPITAL_COMMUNITY): Payer: Medicaid Other

## 2020-02-03 DIAGNOSIS — U071 COVID-19: Secondary | ICD-10-CM | POA: Insufficient documentation

## 2020-02-03 MED ORDER — ALBUTEROL SULFATE HFA 108 (90 BASE) MCG/ACT IN AERS: 2.0000 | INHALATION_SPRAY | Freq: Once | RESPIRATORY_TRACT | Status: DC | PRN

## 2020-02-03 MED ORDER — SODIUM CHLORIDE 0.9 % IV SOLN
1200.0000 mg | Freq: Once | INTRAVENOUS | Status: AC
  Administered 2020-02-03: 1200 mg via INTRAVENOUS
  Filled 2020-02-03: qty 10

## 2020-02-03 MED ORDER — FAMOTIDINE IN NACL 20-0.9 MG/50ML-% IV SOLN: 20.0000 mg | Freq: Once | INTRAVENOUS | Status: DC | PRN

## 2020-02-03 MED ORDER — SODIUM CHLORIDE 0.9 % IV SOLN: INTRAVENOUS | Status: DC | PRN

## 2020-02-03 MED ORDER — METHYLPREDNISOLONE SODIUM SUCC 125 MG IJ SOLR: 125.0000 mg | Freq: Once | INTRAMUSCULAR | Status: DC | PRN

## 2020-02-03 MED ORDER — EPINEPHRINE 0.3 MG/0.3ML IJ SOAJ: 0.3000 mg | Freq: Once | INTRAMUSCULAR | Status: DC | PRN

## 2020-02-03 MED ORDER — DIPHENHYDRAMINE HCL 50 MG/ML IJ SOLN: 50.0000 mg | Freq: Once | INTRAMUSCULAR | Status: DC | PRN

## 2020-02-03 NOTE — Discharge Instructions (Signed)

## 2020-02-03 NOTE — Progress Notes (Signed)
.   Diagnosis: COVID-19  Physician: Dr. Wright  Procedure: [facility] Med: casirivimab\imdevimab infusion - Provided patient with casirivimab\imdevimab fact sheet for patients, parents and caregivers prior to infusion.  Complications: No immediate complications noted.  Discharge: Discharged home   Heidi Berry R Jancarlo Biermann 02/03/2020  ] 

## 2020-05-12 ENCOUNTER — Ambulatory Visit: Payer: Medicaid Other | Admitting: Obstetrics and Gynecology

## 2020-06-13 ENCOUNTER — Ambulatory Visit: Payer: Medicaid Other | Admitting: Obstetrics and Gynecology

## 2020-07-07 ENCOUNTER — Other Ambulatory Visit: Payer: Self-pay

## 2020-07-07 ENCOUNTER — Other Ambulatory Visit (HOSPITAL_COMMUNITY)
Admission: RE | Admit: 2020-07-07 | Discharge: 2020-07-07 | Disposition: A | Discharge status: Home / Self Care | Payer: Medicaid Other | Source: Ambulatory Visit | Attending: Obstetrics and Gynecology | Admitting: Obstetrics and Gynecology

## 2020-07-07 ENCOUNTER — Ambulatory Visit (INDEPENDENT_AMBULATORY_CARE_PROVIDER_SITE_OTHER): Payer: Medicaid Other | Admitting: Obstetrics and Gynecology

## 2020-07-07 ENCOUNTER — Encounter: Payer: Self-pay | Admitting: Obstetrics and Gynecology

## 2020-07-07 VITALS — BP 197/121 | HR 96 | Ht 65.0 in | Wt 333.8 lb

## 2020-07-07 DIAGNOSIS — Z01419 Encounter for gynecological examination (general) (routine) without abnormal findings: Secondary | ICD-10-CM | POA: Diagnosis present

## 2020-07-07 MED ORDER — NORETHINDRONE 0.35 MG PO TABS: 1.0000 | ORAL_TABLET | Freq: Every day | ORAL | 4 refills | Status: DC

## 2020-07-07 NOTE — Progress Notes (Signed)
Pt presents today for annual exam. 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 LMP: 07/01/20. Pt not currently on anything for [facility], but would like to discuss options.

## 2020-07-07 NOTE — Progress Notes (Signed)
Subjective:     Heidi Berry is a 40 y.o. female P1 with LMP 07/01/20 and BMI 55 who is here for a comprehensive physical exam. The patient reports no problems. She is not sexually active. She reports a monthly period lasting 7-10 days without dysmenorrhea. Patient is interested in contraception for cycle control. Patient denies pelvic pain or abnormal discharge. She is interested in STD testing.   Past Medical History:  Diagnosis Date  . Anxiety   . BV (bacterial vaginosis)   . Hypertension   . Migraine   . Obesity, morbid, BMI 50 or higher (HCC)   . PFS (patellofemoral syndrome)    right   Past Surgical History:  Procedure Laterality Date  . CHOLECYSTECTOMY     Family History  Problem Relation Age of Onset  . Diabetes Mother   . Hypertension Mother   . Endometrial cancer Mother   . Congestive Heart Failure Mother   . Diabetes Father   . Hypertension Father   . Kidney failure Father   . Colon cancer Neg Hx   . Esophageal cancer Neg Hx   . Pancreatic cancer Neg Hx   . Liver disease Neg Hx     Social History   Socioeconomic History  . Marital status: Single    Spouse name: Not on file  . Number of children: Not on file  . Years of education: Not on file  . Highest education level: Not on file  Occupational History  . Not on file  Tobacco Use  . Smoking status: Never Smoker  . Smokeless tobacco: Never Used  Vaping Use  . Vaping Use: Never used  Substance and Sexual Activity  . Alcohol use: Yes    Alcohol/week: 0.0 standard drinks    Comment: seldom  . Drug use: No  . Sexual activity: Not Currently    Partners: Male    Birth control/protection: I.U.D.  Other Topics Concern  . Not on file  Social History Narrative  . Not on file   Social Determinants of Health   Financial Resource Strain: Not on file  Food Insecurity: Not on file  Transportation Needs: Not on file  Physical Activity: Not on file  Stress: Not on file  Social Connections: Not on file   Intimate Partner Violence: Not on file   Health Maintenance  Topic Date Due  . Hepatitis C Screening  Never done  . COVID-19 Vaccine (1) Never done  . URINE MICROALBUMIN  Never done  . HIV Screening  Never done  . MAMMOGRAM  Never done  . INFLUENZA VACCINE  01/10/2020  . PAP SMEAR-Modifier  05/29/2020  . TETANUS/TDAP  11/20/2023       Review of Systems Pertinent items noted in HPI and remainder of comprehensive ROS otherwise negative.   Objective:  Blood pressure (!) 197/121, pulse 96, height 5\' 5"  (1.651 m), weight (!) 333 lb 12.8 oz (151.4 kg), last menstrual period 07/01/2020.     GENERAL: Well-developed, well-nourished female in no acute distress.  HEENT: Normocephalic, atraumatic. Sclerae anicteric.  NECK: Supple. Normal thyroid.  LUNGS: Clear to auscultation bilaterally.  HEART: Regular rate and rhythm. BREASTS: Symmetric in size. No palpable masses or lymphadenopathy, skin changes, or nipple drainage. ABDOMEN: Soft, nontender, nondistended. No organomegaly. PELVIC: Normal external female genitalia with a subcentimeter ingrown follicle on right side of mons pubis. Vagina is pink and rugated.  Normal discharge. Normal appearing cervix. Bimanual exam limited secondary to body habitus. EXTREMITIES: No cyanosis, clubbing, or edema, 2+ distal  pulses.    Assessment:    Healthy female exam.      Plan:    Pap smear collected Screening mammogram ordered STD testing per patient request Patient to follow up with PCP for management of HTN. Patient admits that she did not take her blood pressure medication this morning Rx POP provided. Patient is considering returning for Mirena IUD See After Visit Summary for Counseling Recommendations

## 2020-07-08 LAB — RPR+HBSAG+HCVAB+...
HIV Screen 4th Generation wRfx: NONREACTIVE
Hep C Virus Ab: <0.1 s/co ratio (ref 0.0–0.9)
Hepatitis B Surface Ag: NEGATIVE
RPR Ser Ql: NONREACTIVE

## 2020-07-11 LAB — CYTOLOGY - PAP
Comment: NEGATIVE
Diagnosis: NEGATIVE
High risk HPV: NEGATIVE

## 2020-07-11 LAB — CERVICOVAGINAL ANCILLARY ONLY
Bacterial Vaginitis (gardnerella): NEGATIVE
Candida Glabrata: NEGATIVE
Candida Vaginitis: NEGATIVE
Chlamydia: NEGATIVE
Comment: NEGATIVE
Comment: NEGATIVE
Comment: NEGATIVE
Comment: NEGATIVE
Comment: NEGATIVE
Comment: NORMAL
Neisseria Gonorrhea: NEGATIVE
Trichomonas: NEGATIVE

## 2020-07-21 ENCOUNTER — Encounter: Payer: Self-pay | Admitting: Gastroenterology

## 2020-08-05 ENCOUNTER — Ambulatory Visit (INDEPENDENT_AMBULATORY_CARE_PROVIDER_SITE_OTHER): Payer: Medicaid Other | Admitting: Gastroenterology

## 2020-08-05 ENCOUNTER — Encounter: Payer: Self-pay | Admitting: Gastroenterology

## 2020-08-05 VITALS — BP 140/80 | HR 84 | Ht 65.75 in | Wt 334.0 lb

## 2020-08-05 DIAGNOSIS — K219 Gastro-esophageal reflux disease without esophagitis: Secondary | ICD-10-CM | POA: Diagnosis not present

## 2020-08-05 DIAGNOSIS — R110 Nausea: Secondary | ICD-10-CM

## 2020-08-05 DIAGNOSIS — K529 Noninfective gastroenteritis and colitis, unspecified: Secondary | ICD-10-CM | POA: Insufficient documentation

## 2020-08-05 MED ORDER — CHOLESTYRAMINE 4 G PO PACK: 4.0000 g | PACK | Freq: Every day | ORAL | 11 refills | Status: DC

## 2020-08-05 NOTE — Progress Notes (Signed)
08/05/2020 Heidi Berry [id number] June 22, 1980   HISTORY OF PRESENT ILLNESS: This is a 40 year old female who is a patient of Dr. Christella Hartigan, known to him only for one visit back in 2016.  She has a longstanding history of postprandial diarrhea/loose stools.  Occurs every time that she eats.  To the point that she is only eating once a day because she is afraid to eat during the day while she is working.  She had colonoscopy and EGD in the [place], [place] in 2005.  She says that they were both normal including normal biopsies of the colon.  We do not have reports of those, however.  She had her gallbladder removed in 2010.  When she was here to see Dr. Christella Hartigan in 2016 he prescribed Questran and she recalls that that did seem to help and would like to try that again.  Labs for celiac disease at that time were negative.  She admits to intermittent nausea.  She does have reflux, but is currently only taking zantac 150 mg as needed, which does help when she takes it. Reports lower abdominal pains that she describes as cramping in nature. She says that she does not evaluate her stools for blood.  She has been referred here on this occasion by her PCP, Dr. Earnest Bailey, in order to discuss and consider colonoscopy.  Past Medical History:  Diagnosis Date  . Anemia   . Anxiety   . BV (bacterial vaginosis)   . Depression   . DM (diabetes mellitus) (HCC)   . Gallstones   . Hypertension   . IBS (irritable bowel syndrome)   . Migraine   . Obesity, morbid, BMI 50 or higher (HCC)   . PFS (patellofemoral syndrome)    right   Past Surgical History:  Procedure Laterality Date  . CHOLECYSTECTOMY      reports that she has never smoked. She has never used smokeless tobacco. She reports current alcohol use. She reports that she does not use drugs. family history includes Congestive Heart Failure in her mother; Diabetes in her father, mother, and sister; Hypertension in her father, mother, and sister; Kidney  disease in her mother; Kidney failure in her father; Ovarian cancer in her mother. No Known Allergies    Outpatient Encounter Medications as of 08/05/2020  Medication Sig  . acetaminophen (TYLENOL) 500 MG tablet Take by mouth.  . baclofen (LIORESAL) 10 MG tablet Take 10 mg by mouth 3 (three) times daily.  . busPIRone (BUSPAR) 15 MG tablet Take 15 mg by mouth 3 (three) times daily.  . cetirizine (ZYRTEC) 10 MG tablet Take 10 mg by mouth daily.  Marland Kitchen escitalopram (LEXAPRO) 10 MG tablet Take 10 mg by mouth daily.  . ferrous sulfate 325 (65 FE) MG EC tablet Take by mouth.  Marland Kitchen ibuprofen (ADVIL,MOTRIN) 800 MG tablet Take by mouth.  Marland Kitchen lisinopril (ZESTRIL) 10 MG tablet Take 10 mg by mouth daily.  . metFORMIN (GLUCOPHAGE-XR) 500 MG 24 hr tablet Take 2 tablets by mouth once.  . norethindrone (MICRONOR) 0.35 MG tablet Take 1 tablet (0.35 mg total) by mouth daily.  . ranitidine (ZANTAC) 150 MG tablet Take by mouth.  . valACYclovir (VALTREX) 1000 MG tablet Take 500 mg by mouth 2 (two) times daily. X 3 days  . losartan (COZAAR) 50 MG tablet Take 1 tablet by mouth daily. (Patient not taking: Reported on 08/05/2020)   No facility-administered encounter medications on file as of 08/05/2020.     REVIEW OF SYSTEMS  :  All other systems reviewed and negative except where noted in the History of Present Illness.   PHYSICAL EXAM: BP 140/80 (BP Location: Left Arm, Patient Position: Sitting, Cuff Size: Normal)   Pulse 84   Ht 5' 5.75" (1.67 m) Comment: height measured without shoes  Wt (!) 334 lb (151.5 kg)   LMP 07/16/2020   BMI 54.32 kg/m  General: Well developed AA female in no acute distress Head: Normocephalic and atraumatic Eyes:  Sclerae anicteric, conjunctiva pink. Ears: Normal auditory acuity Lungs: Clear throughout to auscultation; no W/R/R. Heart: Regular rate and rhythm; no M/R/G. Abdomen:  Soft, non-distended.  BS present.  Non-tender. Rectal:  Will be done at the time of  colonoscopy. Musculoskeletal: Symmetrical with no gross deformities  Skin: No lesions on visible extremities Extremities: No edema  Neurological: Alert oriented x 4, grossly non-focal Psychological:  Alert and cooperative. Normal mood and affect  ASSESSMENT AND PLAN: *Chronic diarrhea: This has been an ongoing issue for many years, had a colonoscopy in 2005 in the [place], [place] that she reports was normal.  This has been attibuted to IBS-D and possibly worsened by component of bile salt related diarrhea after her cholecystectomy.  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  She does feel like the Questran that she was given by Dr. Christella Hartigan in 2016 did help.  We will prescribe that for her again, one packet daily to start.  Prescription sent to pharmacy. *Intermittent nausea/reflux: She is currently only taking Zantac 150 mg as needed.  May need daily medication pending results of EGD.  **I think that is probably worth repeating EGD and colonoscopy since her last were almost 17 years ago.  It certainly would not hurt to take another look and repeat random biopsies throughout the colon, etc. to rule out microscopic colitis again and be sure there is nothing else contributing to her symptoms.  We will plan for both EGD and colonoscopy with Dr. Christella Hartigan.  These will need to be performed at [facility] long hospital due to her BMI greater than 50.  Dr. Christella Hartigan, please indicate when we could put her on your hospital schedule.   CC:  Macy Mis, MD

## 2020-08-05 NOTE — Patient Instructions (Addendum)
If you are age 41 or older, your body mass index should be between 23-30. Your Body mass index is 54.32 kg/m. If this is out of the aforementioned range listed, please consider follow up with your Primary Care Provider.  If you are age 58 or younger, your body mass index should be between 19-25. Your Body mass index is 54.32 kg/m. If this is out of the aformentioned range listed, please consider follow up with your Primary Care Provider.   We have sent the following medications to your pharmacy for you to pick up at your convenience: Questran 1 packet daily.  Dr. Christella Hartigan nurse will schedule procedure at the hospital.

## 2020-08-08 NOTE — Progress Notes (Signed)
I agree with the above note, plan 

## 2020-08-15 ENCOUNTER — Telehealth: Payer: Self-pay

## 2020-08-15 NOTE — Telephone Encounter (Signed)
-----   Message from Rachael Fee, MD sent at 08/15/2020  7:24 AM EST ----- Yes, that is a good plan. I'll forward to Dennis Killilea as well.  Thanks   ----- Message ----- From: Leta Baptist, PA-C Sent: 08/12/2020   2:17 PM EST To: Rachael Fee, MD  Hey!!  I am assuming that you are ok with [place] putting her on your next available for EGD/colonoscopy at [facility]?  Thank you,  Jess

## 2020-08-16 ENCOUNTER — Other Ambulatory Visit: Payer: Self-pay

## 2020-08-16 DIAGNOSIS — K219 Gastro-esophageal reflux disease without esophagitis: Secondary | ICD-10-CM

## 2020-08-16 DIAGNOSIS — K529 Noninfective gastroenteritis and colitis, unspecified: Secondary | ICD-10-CM

## 2020-08-16 DIAGNOSIS — R110 Nausea: Secondary | ICD-10-CM

## 2020-08-16 MED ORDER — PEG 3350-KCL-NA BICARB-NACL 420 G PO SOLR: 4000.0000 mL | Freq: Once | ORAL | 0 refills | Status: AC

## 2020-08-16 NOTE — Telephone Encounter (Signed)
Endo colon scheduled for 10/13/20 at [facility] at 830 am with Dr Christella Hartigan. COVID test on 5/2 at 1030 am   Left message on machine to call back

## 2020-08-17 NOTE — Telephone Encounter (Signed)
EGD Colon scheduled, pt instructed and medications reviewed.  Patient instructions mailed to home.  Patient to call with any questions or concerns.  

## 2020-08-22 ENCOUNTER — Ambulatory Visit: Payer: Medicaid Other

## 2020-10-02 ENCOUNTER — Other Ambulatory Visit: Payer: Self-pay

## 2020-10-02 ENCOUNTER — Emergency Department (HOSPITAL_COMMUNITY): Payer: Medicaid Other

## 2020-10-02 ENCOUNTER — Emergency Department (HOSPITAL_COMMUNITY)
Admission: EM | Admit: 2020-10-02 | Discharge: 2020-10-03 | Disposition: A | Discharge status: Home / Self Care | Payer: Medicaid Other | Attending: Emergency Medicine | Admitting: Emergency Medicine

## 2020-10-02 ENCOUNTER — Encounter (HOSPITAL_COMMUNITY): Payer: Self-pay | Admitting: Emergency Medicine

## 2020-10-02 DIAGNOSIS — I10 Essential (primary) hypertension: Secondary | ICD-10-CM | POA: Insufficient documentation

## 2020-10-02 DIAGNOSIS — Z79899 Other long term (current) drug therapy: Secondary | ICD-10-CM | POA: Diagnosis not present

## 2020-10-02 DIAGNOSIS — R079 Chest pain, unspecified: Secondary | ICD-10-CM | POA: Diagnosis present

## 2020-10-02 DIAGNOSIS — Z7984 Long term (current) use of oral hypoglycemic drugs: Secondary | ICD-10-CM | POA: Insufficient documentation

## 2020-10-02 DIAGNOSIS — E119 Type 2 diabetes mellitus without complications: Secondary | ICD-10-CM | POA: Insufficient documentation

## 2020-10-02 DIAGNOSIS — Z8249 Family history of ischemic heart disease and other diseases of the circulatory system: Secondary | ICD-10-CM | POA: Diagnosis not present

## 2020-10-02 DIAGNOSIS — Z833 Family history of diabetes mellitus: Secondary | ICD-10-CM | POA: Insufficient documentation

## 2020-10-02 DIAGNOSIS — F32A Depression, unspecified: Secondary | ICD-10-CM | POA: Insufficient documentation

## 2020-10-02 DIAGNOSIS — R0789 Other chest pain: Secondary | ICD-10-CM

## 2020-10-02 DIAGNOSIS — I119 Hypertensive heart disease without heart failure: Secondary | ICD-10-CM | POA: Insufficient documentation

## 2020-10-02 LAB — BASIC METABOLIC PANEL
Anion gap: 9 (ref 5–15)
BUN: 11 mg/dL (ref 6–20)
CO2: 25 mmol/L (ref 22–32)
Calcium: 9.2 mg/dL (ref 8.9–10.3)
Chloride: 102 mmol/L (ref 98–111)
Creatinine, Ser: 0.86 mg/dL (ref 0.44–1.00)
GFR, Estimated: >60 mL/min (ref >60)
Glucose, Bld: 153 mg/dL — ABNORMAL HIGH (ref 70–99)
Potassium: 4.1 mmol/L (ref 3.5–5.1)
Sodium: 136 mmol/L (ref 135–145)

## 2020-10-02 LAB — CBG MONITORING, ED: Glucose-Capillary: 146 mg/dL — ABNORMAL HIGH (ref 70–99)

## 2020-10-02 LAB — CBC
HCT: 32.9 % — ABNORMAL LOW (ref 36.0–46.0)
Hemoglobin: 10.5 g/dL — ABNORMAL LOW (ref 12.0–15.0)
MCH: 26.9 pg (ref 26.0–34.0)
MCHC: 31.9 g/dL (ref 30.0–36.0)
MCV: 84.4 fL (ref 80.0–100.0)
Platelets: 361 10*3/uL (ref 150–400)
RBC: 3.9 MIL/uL (ref 3.87–5.11)
RDW: 14.4 % (ref 11.5–15.5)
WBC: 8.3 10*3/uL (ref 4.0–10.5)
nRBC: 0 % (ref 0.0–0.2)

## 2020-10-02 LAB — TROPONIN I (HIGH SENSITIVITY): Troponin I (High Sensitivity): 3 ng/L (ref <18)

## 2020-10-02 LAB — I-STAT BETA HCG BLOOD, ED (MC, WL, AP ONLY): I-stat hCG, quantitative: <5 m[IU]/mL (ref <5)

## 2020-10-02 NOTE — ED Triage Notes (Signed)
Patient arrives complaining of central/right sided chest pain. Patient states that she has been monitoring her sugar and BP at home and has gotten hypertensive. Patient states pain on inspiration, but no other symptoms.

## 2020-10-02 NOTE — ED Provider Notes (Signed)
[facility]-EMERGENCY DEPT Provider Note   CSN: [id number] Arrival date & time: 10/02/20  2116     History Chief Complaint  Patient presents with  . Chest Pain    Heidi Berry is a 40 y.o. female with PMH/o DM, HTN, IBS, Depression who presents for evaluation of right-sided chest pain that began yesterday.  She reports that she was eating out on a deck when the chest pain started.  No associated diaphoresis, nausea/vomiting, difficulty breathing with the pain.  She states that she does not feel like it is completely have resolved since it started.  She states it has had episodes where it has eased off but states that is never gone away.  States it is on the right side and does not radiate.  It is a sharp pain.  She states it is worse with deep inspiration.  She states she may have noticed it was slightly worsening she tried to raise her hand.  It is not worse when she lays down or changes positions.  It is not worse with exertion.  She reports taking Tylenol with some improvement in her pain.  She does not recall any previous trauma, injury, fall.  She has not been sick recently with any fever, cough, chills.  She has a history of hypertension and diabetes.  She does note that she has been checking her blood pressure since her symptoms started and noted her blood pressure was slightly higher.  She has been taking her medications.  She does not smoke.  She denies any cocaine, heroin, marijuana use. She denies any OCP use, recent immobilization, prior history of DVT/PE, recent surgery, leg swelling, or long travel.  The history is provided by the patient.       Past Medical History:  Diagnosis Date  . Anemia   . Anxiety   . BV (bacterial vaginosis)   . Depression   . DM (diabetes mellitus) (HCC)   . Gallstones   . Hypertension   . IBS (irritable bowel syndrome)   . Migraine   . Obesity, morbid, BMI 50 or higher (HCC)   . PFS (patellofemoral syndrome)    right     Patient Active Problem List   Diagnosis Date Noted  . Nausea without vomiting 08/05/2020  . Gastroesophageal reflux disease 08/05/2020  . Chronic diarrhea 08/05/2020    Past Surgical History:  Procedure Laterality Date  . CHOLECYSTECTOMY       OB History    Gravida  1   Para  1   Term  1   Preterm      AB      Living  1     SAB      IAB      Ectopic      Multiple      Live Births  1           Family History  Problem Relation Age of Onset  . Diabetes Mother   . Hypertension Mother   . Congestive Heart Failure Mother   . Ovarian cancer Mother   . Kidney disease Mother   . Diabetes Father   . Hypertension Father   . Kidney failure Father   . Hypertension Sister   . Diabetes Sister   . Colon cancer Neg Hx   . Esophageal cancer Neg Hx   . Pancreatic cancer Neg Hx   . Liver disease Neg Hx     Social History   Tobacco Use  .  Smoking status: Never Smoker  . Smokeless tobacco: Never Used  Vaping Use  . Vaping Use: Some days  . Substances: Nicotine  . Devices: Hooka  Substance Use Topics  . Alcohol use: Yes    Alcohol/week: 0.0 standard drinks    Comment: seldom  . Drug use: No    Home Medications Prior to Admission medications   Medication Sig Start Date End Date Taking? Authorizing Provider  acetaminophen (TYLENOL) 500 MG tablet Take by mouth.   Yes [provider]  escitalopram (LEXAPRO) 10 MG tablet Take 10 mg by mouth daily. 05/15/20  Yes [provider]  ferrous sulfate 325 (65 FE) MG EC tablet Take by mouth. 01/15/20  Yes [provider]  ibuprofen (ADVIL,MOTRIN) 800 MG tablet Take 800 mg by mouth daily as needed for mild pain, moderate pain or cramping. 05/10/14  Yes [provider]  losartan (COZAAR) 50 MG tablet Take 1 tablet by mouth daily. 07/15/20  Yes [provider]  metFORMIN (GLUCOPHAGE-XR) 500 MG 24 hr tablet Take 2 tablets by mouth once. 01/15/20  Yes [provider]   methocarbamol (ROBAXIN) 500 MG tablet Take 1 tablet (500 mg total) by mouth 2 (two) times daily. 10/03/20  Yes Maxwell Caul, PA-C  ranitidine (ZANTAC) 150 MG tablet Take 150 mg by mouth daily as needed. 09/10/16  Yes [provider]  valACYclovir (VALTREX) 1000 MG tablet Take 500 mg by mouth 2 (two) times daily. X 3 days   Yes [provider]  norethindrone (MICRONOR) 0.35 MG tablet Take 1 tablet (0.35 mg total) by mouth daily. 07/07/20   Constant, Peggy, MD    Allergies    Patient has no known allergies.  Review of Systems   Review of Systems  Constitutional: Negative for fever.  Respiratory: Negative for cough and shortness of breath.   Cardiovascular: Positive for chest pain. Negative for leg swelling.  Gastrointestinal: Negative for abdominal pain, nausea and vomiting.  Genitourinary: Negative for dysuria and hematuria.  Neurological: Negative for headaches.  All other systems reviewed and are negative.   Physical Exam Updated Vital Signs BP (!) 144/78   Pulse 74   Temp 98.8 F (37.1 C)   Resp 18   Ht 5\' 5"  (1.651 m)   Wt (!) 151.5 kg   LMP 09/11/2020 (Approximate)   SpO2 99%   BMI 55.58 kg/m   Physical Exam Vitals and nursing note reviewed.  Constitutional:      Appearance: Normal appearance. She is well-developed.  HENT:     Head: Normocephalic and atraumatic.  Eyes:     General: Lids are normal.     Conjunctiva/sclera: Conjunctivae normal.     Pupils: Pupils are equal, round, and reactive to light.  Cardiovascular:     Rate and Rhythm: Normal rate and regular rhythm.     Pulses: Normal pulses.          Radial pulses are 2+ on the right side and 2+ on the left side.       Dorsalis pedis pulses are 2+ on the right side and 2+ on the left side.     Heart sounds: Normal heart sounds. No murmur heard. No friction rub. No gallop.   Pulmonary:     Effort: Pulmonary effort is normal.     Breath sounds: Normal breath sounds.     Comments: Lungs  clear to auscultation bilaterally.  Symmetric chest rise.  No wheezing, rales, rhonchi. Chest:     Comments: Slight reproduction  of pain with palpation of the anterior chest wall.  Abdominal:     Palpations: Abdomen is soft. Abdomen is not rigid.     Tenderness: There is no abdominal tenderness. There is no guarding.  Musculoskeletal:        General: Normal range of motion.     Cervical back: Full passive range of motion without pain.     Comments: BLE are symmetric in appearance without any overlying warmth, erythema, edema.   Skin:    General: Skin is warm and dry.     Capillary Refill: Capillary refill takes less than 2 seconds.  Neurological:     Mental Status: She is alert and oriented to person, place, and time.  Psychiatric:        Speech: Speech normal.     ED Results / Procedures / Treatments   Labs (all labs ordered are listed, but only abnormal results are displayed) Labs Reviewed  BASIC METABOLIC PANEL - Abnormal; Notable for the following components:      Result Value   Glucose, Bld 153 (*)    All other components within normal limits  CBC - Abnormal; Notable for the following components:   Hemoglobin 10.5 (*)    HCT 32.9 (*)    All other components within normal limits  D-DIMER, QUANTITATIVE - Abnormal; Notable for the following components:   D-Dimer, Quant 0.55 (*)    All other components within normal limits  CBG MONITORING, ED - Abnormal; Notable for the following components:   Glucose-Capillary 146 (*)    All other components within normal limits  I-STAT BETA HCG BLOOD, ED ([facility], [facility], [facility] ONLY)  TROPONIN I (HIGH SENSITIVITY)  TROPONIN I (HIGH SENSITIVITY)    EKG None   EKG, NSR, Rate 87  Radiology DG Chest 2 View  Result Date: 10/02/2020 CLINICAL DATA:  Chest pain. EXAM: CHEST - 2 VIEW COMPARISON:  None. FINDINGS: Upper normal heart size.The cardiomediastinal contours are normal. The lungs are clear. Pulmonary vasculature is normal. No consolidation,  pleural effusion, or pneumothorax. No acute osseous abnormalities are seen. IMPRESSION: Borderline cardiomegaly. No acute chest findings. Electronically Signed   By: Narda RutherfordMelanie  Sanford M.D.   On: 10/02/2020 22:13   CT Angio Chest PE W/Cm &/Or Wo Cm  Result Date: 10/03/2020 CLINICAL DATA:  Pulmonary embolus suspected with low to intermediate probability. Positive D-dimer. Right-sided chest pain since last night. EXAM: CT ANGIOGRAPHY CHEST WITH CONTRAST TECHNIQUE: Multidetector CT imaging of the chest was performed using the standard protocol during bolus administration of intravenous contrast. Multiplanar CT image reconstructions and MIPs were obtained to evaluate the vascular anatomy. CONTRAST:  100mL OMNIPAQUE IOHEXOL 350 MG/ML SOLN COMPARISON:  Chest radiograph 10/02/2020 FINDINGS: Cardiovascular: Moderately good opacification of the central and proximal segmental pulmonary arteries. No obvious filling defect is identified suggesting no evidence of significant pulmonary embolus. More peripheral vessels may be obscured. Normal heart size. No pericardial effusions. Normal caliber thoracic aorta. No aortic dissection. Great vessel origins are patent. Mediastinum/Nodes: Esophagus is decompressed. No significant lymphadenopathy. Thyroid gland is unremarkable. Lungs/Pleura: Lungs are clear. No pleural effusions. No pneumothorax. Upper Abdomen: No acute abnormalities demonstrated in the visualized upper abdomen. Musculoskeletal: Degenerative changes in the spine. No destructive bone lesions. Review of the MIP images confirms the above findings. IMPRESSION: 1. No evidence of significant pulmonary embolus. 2. No evidence of active pulmonary disease. Electronically Signed   By: Burman NievesWilliam  Stevens M.D.   On: 10/03/2020 03:21    Procedures Procedures   Medications Ordered  in ED Medications  iohexol (OMNIPAQUE) 350 MG/ML injection 100 mL (100 mLs Intravenous Contrast Given 10/03/20 0304)    ED Course  I have  reviewed the triage vital signs and the nursing notes.  Pertinent labs & imaging results that were available during my care of the patient were reviewed by me and considered in my medical decision making (see chart for details).    MDM Rules/Calculators/A&P                          40 year old female who presents for evaluation of chest pain that began on 10/01/20.  Reports is the right side.  It does not radiate.  Not worse with exertion.  On initial arrival, she is afebrile, nontoxic-appearing.  Vital signs are stable.  Her blood pressure is slightly hypertensive.  On exam, she has equal pulses in all 4 extremities.  Pain is somewhat reproduced with movement and palpation of the area.  She also reports that it is pleuritic in nature and states that she gets a sharp pain when she takes a deep breath in.  She does not have any PE risk factors and is not tachycardic or hypoxic.  Low suspicion for ACS etiology given states that she has atypical features but is a consideration.  History/physical exam is not concerning for dissection, hypertensive emergency. Consider PE given pleuritic component.  Plan for labs, chest x-ray.  Troponin is negaitve. BMP shows normal BUN/Cr. CBC shows no leukocytosis. Hgb is 10.5. Beta is negative. CXR is borderline cardiomegaly.   Dimer is positive. Will obtain a CTA of chest.   Delta trop is negative.   CTA shows no evidence of PE.  No active pulmonary disease.    Discussed results with patient.  Patient is hemodynamically stable at this time.  Blood pressure improved here in the ED.  We will plan to treat as musculoskeletal pain. At this time, patient exhibits no emergent life-threatening condition that require further evaluation in ED. Patient had ample opportunity for questions and discussion. All patient's questions were answered with full understanding. Strict return precautions discussed. Patient expresses understanding and agreement to plan.   Portions of this  note were generated with Scientist, clinical (histocompatibility and immunogenetics). Dictation errors may occur despite best attempts at proofreading.   Final Clinical Impression(s) / ED Diagnoses Final diagnoses:  Atypical chest pain    Rx / DC Orders ED Discharge Orders         Ordered    methocarbamol (ROBAXIN) 500 MG tablet  2 times daily        10/03/20 0339           Maxwell Caul, PA-C 10/03/20 0503    Palumbo, April, MD 10/03/20 0175

## 2020-10-02 NOTE — ED Triage Notes (Signed)
Emergency Medicine Provider Triage Evaluation Note  Heidi Berry , a 40 y.o. female  was evaluated in triage.  Pt complains of chest pain.  Has right sided chest pain since last night.  Took tylenol without significant change.  Review of Systems  Positive: Chest pain Negative: Fever, sob, cough, leg edema  Physical Exam  BP (!) 180/106 (BP Location: Right Arm)   Pulse 91   Temp 98.3 F (36.8 C) (Oral)   Resp 20   Ht 5' 5.75" (1.67 m)   Wt (!) 151.5 kg   LMP 09/11/2020 (Approximate)   SpO2 99%   BMI 54.32 kg/m  Gen:   Awake, no distress   HEENT:  Atraumatic  Resp:  Normal effort  Cardiac:  Normal rate  Abd:   Nondistended, nontender  MSK:   Moves extremities without difficulty  Neuro:  Speech clear   Medical Decision Making  Medically screening exam initiated at 10:14 PM.  Appropriate orders placed.  Heidi Berry was informed that the remainder of the evaluation will be completed by another provider, this initial triage assessment does not replace that evaluation, and the importance of remaining in the ED until their evaluation is complete.  Clinical Impression  Chest pain   Tilden Fossa, MD 10/02/20 2227

## 2020-10-03 ENCOUNTER — Telehealth: Payer: Self-pay

## 2020-10-03 ENCOUNTER — Encounter (HOSPITAL_COMMUNITY): Payer: Self-pay

## 2020-10-03 ENCOUNTER — Emergency Department (HOSPITAL_COMMUNITY): Payer: Medicaid Other

## 2020-10-03 LAB — D-DIMER, QUANTITATIVE: D-Dimer, Quant: 0.55 ug/mL-FEU — ABNORMAL HIGH (ref 0.00–0.50)

## 2020-10-03 LAB — TROPONIN I (HIGH SENSITIVITY): Troponin I (High Sensitivity): <2 ng/L (ref <18)

## 2020-10-03 MED ORDER — METHOCARBAMOL 500 MG PO TABS
500.0000 mg | ORAL_TABLET | Freq: Two times a day (BID) | ORAL | 0 refills | Status: DC
Start: 2020-10-03 — End: 2023-01-29

## 2020-10-03 MED ORDER — IOHEXOL 350 MG/ML SOLN
100.0000 mL | Freq: Once | INTRAVENOUS | Status: AC | PRN
  Administered 2020-10-03: 100 mL via INTRAVENOUS

## 2020-10-03 NOTE — Discharge Instructions (Signed)
As we discussed, your work-up today was reassuring.  We will plan to treat this as musculoskeletal pain.  You can take Tylenol or Ibuprofen as directed for pain. You can alternate Tylenol and Ibuprofen every 4 hours. If you take Tylenol at 1pm, then you can take Ibuprofen at 5pm. Then you can take Tylenol again at 9pm.   Take Robaxin as prescribed. This medication will make you drowsy so do not drive or drink alcohol when taking it.  Follow-up with your primary care doctor in the next 3 to 4 days.  Return to the Emergency Department immediately if you experiencing worsening chest pain, difficulty breathing, nausea/vomiting, get very sweaty, headache or any other worsening or concerning symptoms.

## 2020-10-03 NOTE — Telephone Encounter (Signed)
The pt has been advised that the appt has been cancelled.  She will call back after her cardiac workup.

## 2020-10-03 NOTE — Telephone Encounter (Signed)
-----   Message from Rachael Fee, MD sent at 10/03/2020  8:58 AM EDT ----- Thanks.   Please cancel her upcoming procedures (at [facility] I believe given her very high BMI).  She needs to follow up with her PCP, consider other testing, cardiac eval for her chest pains before rescheduling.    ----- Message ----- From: Loretha Stapler, RN Sent: 10/03/2020   8:37 AM EDT To: Rachael Fee, MD, [name]  Dr Christella Hartigan see Amy's note below. Please advise.  ----- Message ----- From: [name] Sent: 10/03/2020   8:25 AM EDT To: Loretha Stapler, RN  Heidi Berry, This pt has been in the ER.  Procedure at the hospital next week.  Need to find out if her procedure may need to be pushed out because of current issues she is having with her chest before I start a precert. Thanks, Amy

## 2020-10-10 ENCOUNTER — Other Ambulatory Visit (HOSPITAL_COMMUNITY): Payer: Medicaid Other

## 2020-10-13 ENCOUNTER — Encounter (HOSPITAL_COMMUNITY): Payer: Self-pay

## 2020-10-13 ENCOUNTER — Ambulatory Visit (HOSPITAL_COMMUNITY): Admit: 2020-10-13 | Payer: Medicaid Other | Admitting: Gastroenterology

## 2020-10-13 SURGERY — COLONOSCOPY WITH PROPOFOL
Anesthesia: Monitor Anesthesia Care

## 2021-01-02 ENCOUNTER — Ambulatory Visit: Payer: Medicaid Other | Admitting: Obstetrics and Gynecology

## 2021-01-25 ENCOUNTER — Other Ambulatory Visit: Payer: Self-pay

## 2021-01-25 ENCOUNTER — Ambulatory Visit (INDEPENDENT_AMBULATORY_CARE_PROVIDER_SITE_OTHER): Payer: Medicaid Other | Admitting: Obstetrics and Gynecology

## 2021-01-25 ENCOUNTER — Encounter: Payer: Self-pay | Admitting: Obstetrics and Gynecology

## 2021-01-25 VITALS — BP 158/97 | HR 97 | Ht 65.0 in | Wt 334.5 lb

## 2021-01-25 DIAGNOSIS — Z30430 Encounter for insertion of intrauterine contraceptive device: Secondary | ICD-10-CM

## 2021-01-25 DIAGNOSIS — Z01812 Encounter for preprocedural laboratory examination: Secondary | ICD-10-CM | POA: Diagnosis not present

## 2021-01-25 LAB — POCT URINE PREGNANCY: Preg Test, Ur: NEGATIVE

## 2021-01-25 MED ORDER — LEVONORGESTREL 20 MCG/DAY IU IUD
1.0000 | INTRAUTERINE_SYSTEM | Freq: Once | INTRAUTERINE | Status: AC
  Administered 2021-01-25: 1 via INTRAUTERINE

## 2021-01-25 NOTE — Progress Notes (Signed)
    GYNECOLOGY OFFICE PROCEDURE NOTE  Heidi Berry is a 40 y.o. G1P1001 here for Liletta IUD insertion. No GYN concerns.  Last pap smear was on 07/07/20 and was normal.  IUD Insertion Procedure Note Patient identified, informed consent performed, consent signed.   Discussed risks of irregular bleeding, cramping, infection, malpositioning or misplacement of the IUD outside the uterus which may require further procedure such as laparoscopy. Also discussed >99% contraception efficacy, increased risk of ectopic pregnancy with failure of method.  Time out was performed.  Urine pregnancy test negative.  Speculum placed in the vagina.  Cervix visualized.  Cleaned with Betadine x 2.  Grasped anteriorly with a single tooth tenaculum.  Uterus sounded to 8 cm.  Mirena IUD placed per manufacturer's recommendations.  Strings trimmed to 3 cm. Tenaculum was removed, good hemostasis noted.  Patient tolerated procedure well.   Patient was given post-procedure instructions.  She was advised to have backup contraception for one week.  Patient was also asked to check IUD strings periodically and follow up in 4 weeks for IUD check.   [name], MD, FACOG Obstetrician & Gynecologist, [facility] for [facility], [facility]

## 2021-01-25 NOTE — Progress Notes (Signed)
Pt is in the office for IUD insertion. Currently on BC pills.

## 2021-01-26 ENCOUNTER — Encounter: Payer: Self-pay | Admitting: Obstetrics and Gynecology

## 2021-01-30 ENCOUNTER — Other Ambulatory Visit: Payer: Self-pay | Admitting: Surgery

## 2021-02-06 NOTE — Patient Instructions (Addendum)
DUE TO COVID-19 ONLY ONE VISITOR IS ALLOWED TO COME WITH YOU AND STAY IN THE WAITING ROOM ONLY DURING PRE OP AND PROCEDURE DAY OF SURGERY IF YOU ARE GOING HOME AFTER SURGERY. IF YOU ARE SPENDING THE NIGHT 2 PEOPLE MAY VISIT WITH YOU IN YOUR PRIVATE ROOM AFTER SURGERY UNTIL VISITING  HOURS ARE OVER AT 800 PM AND THE 2 VISITORS CANNOT SPEND THE NIGHT.               Heidi Berry     Your procedure is scheduled on: 02/16/21   Report to [facility] Main  Entrance   Report to admitting at   9:30 AM     Call this number if you have problems the morning of surgery [phone]    Remember: Do not eat food  :After Midnight the night before your surgery,     You may have clear liquids from midnight until 8:30 AM    CLEAR LIQUID DIET   Foods Allowed                                                                     Foods Excluded  Coffee and tea, regular and decaf                             liquids that you cannot   NO MILK OR CREAMER Plain Jell-O any favor except red or purple                                           see through such as: Fruit ices (not with fruit pulp)                                     milk, soups, orange juice  Iced Popsicles                                    All solid food Carbonated beverages, regular and diet                                    Cranberry, grape and apple juices Sports drinks like Gatorade Lightly seasoned clear broth or consume(fat free) Sugar   BRUSH YOUR TEETH MORNING OF SURGERY AND RINSE YOUR MOUTH OUT, NO CHEWING GUM CANDY OR MINTS.     Take these medicines the morning of surgery with A SIP OF WATER: Escitalopram, Famotidine                                You may not have any metal on your body including hair pins and              piercings  Do not wear jewelry, make-up, lotions, powders or perfumes, deodorant  Do not wear nail polish on your fingernails.  Do not shave  48 hours prior to surgery.                 Do not bring valuables to the hospital. [facility] IS NOT             RESPONSIBLE   FOR VALUABLES.  Contacts, dentures or bridgework may not be worn into surgery.  Leave suitcase in the car. After surgery it may be brought to your room.     Patients discharged the day of surgery will not be allowed to drive home.   IF YOU ARE HAVING SURGERY AND GOING HOME THE SAME DAY, YOU MUST HAVE AN ADULT TO DRIVE YOU HOME AND BE WITH YOU FOR 24 HOURS.  YOU MAY GO HOME BY TAXI OR UBER OR ORTHERWISE, BUT AN ADULT MUST ACCOMPANY YOU HOME AND STAY WITH YOU FOR 24 HOURS.  Name and phone number of your driver:  Special Instructions: N/A              Please read over the following fact sheets you were given: _____________________________________________________________________             [facility] - Preparing for Surgery Before surgery, you can play an important role.  Because skin is not sterile, your skin needs to be as free of germs as possible.  You can reduce the number of germs on your skin by washing with CHG (chlorahexidine gluconate) soap before surgery.  CHG is an antiseptic cleaner which kills germs and bonds with the skin to continue killing germs even after washing. Please DO NOT use if you have an allergy to CHG or antibacterial soaps.  If your skin becomes reddened/irritated stop using the CHG and inform your nurse when you arrive at Short Stay. Do not shave (including legs and underarms) for at least 48 hours prior to the first CHG shower.   Please follow these instructions carefully:  1.  Shower with CHG Soap the night before surgery and the  morning of Surgery.  2.  If you choose to wash your hair, wash your hair first as usual with your  normal  shampoo.  3.  After you shampoo, rinse your hair and body thoroughly to remove the  shampoo.                            4.  Use CHG as you would any other liquid soap.  You can apply chg directly  to the skin and wash                        Gently with a scrungie or clean washcloth.  5.  Apply the CHG Soap to your body ONLY FROM THE NECK DOWN.   Do not use on face/ open                           Wound or open sores. Avoid contact with eyes, ears mouth and genitals (private parts).                       Wash face,  Genitals (private parts) with your normal soap.             6.  Wash thoroughly, paying special attention to the area where your surgery  will be performed.  7.  Thoroughly rinse your body with warm water from the neck down.  8.  DO NOT shower/wash with your normal soap after using and rinsing off  the CHG Soap.             9.  Pat yourself dry with a clean towel.            10.  Wear clean pajamas.            11.  Place clean sheets on your bed the night of your first shower and do not  sleep with pets. Day of Surgery : Do not apply any lotions/deodorants the morning of surgery.  Please wear clean clothes to the hospital/surgery center.  FAILURE TO FOLLOW THESE INSTRUCTIONS MAY RESULT IN THE CANCELLATION OF YOUR SURGERY PATIENT SIGNATURE_________________________________  NURSE SIGNATURE__________________________________  ________________________________________________________________________

## 2021-02-07 ENCOUNTER — Encounter (HOSPITAL_COMMUNITY): Payer: Self-pay

## 2021-02-07 ENCOUNTER — Other Ambulatory Visit: Payer: Self-pay

## 2021-02-07 ENCOUNTER — Encounter (HOSPITAL_COMMUNITY)
Admission: RE | Admit: 2021-02-07 | Discharge: 2021-02-07 | Disposition: A | Discharge status: Home / Self Care | Payer: Medicaid Other | Source: Ambulatory Visit | Attending: Surgery | Admitting: Surgery

## 2021-02-07 DIAGNOSIS — Z01812 Encounter for preprocedural laboratory examination: Secondary | ICD-10-CM | POA: Diagnosis not present

## 2021-02-07 LAB — CBC
HCT: 32.9 % — ABNORMAL LOW (ref 36.0–46.0)
Hemoglobin: 10.1 g/dL — ABNORMAL LOW (ref 12.0–15.0)
MCH: 26 pg (ref 26.0–34.0)
MCHC: 30.7 g/dL (ref 30.0–36.0)
MCV: 84.6 fL (ref 80.0–100.0)
Platelets: 414 10*3/uL — ABNORMAL HIGH (ref 150–400)
RBC: 3.89 MIL/uL (ref 3.87–5.11)
RDW: 14 % (ref 11.5–15.5)
WBC: 7.4 10*3/uL (ref 4.0–10.5)
nRBC: 0 % (ref 0.0–0.2)

## 2021-02-07 LAB — BASIC METABOLIC PANEL
Anion gap: 11 (ref 5–15)
BUN: 11 mg/dL (ref 6–20)
CO2: 22 mmol/L (ref 22–32)
Calcium: 8.9 mg/dL (ref 8.9–10.3)
Chloride: 105 mmol/L (ref 98–111)
Creatinine, Ser: 0.81 mg/dL (ref 0.44–1.00)
GFR, Estimated: >60 mL/min (ref >60)
Glucose, Bld: 160 mg/dL — ABNORMAL HIGH (ref 70–99)
Potassium: 3.9 mmol/L (ref 3.5–5.1)
Sodium: 138 mmol/L (ref 135–145)

## 2021-02-07 LAB — GLUCOSE, CAPILLARY: Glucose-Capillary: 163 mg/dL — ABNORMAL HIGH (ref 70–99)

## 2021-02-07 NOTE — Progress Notes (Signed)
COVID Test Completed: NA   PCP - Dr. Lanelle Bal Cardiologist - none  Chest x-ray - no EKG - 10/10/20-epic Stress Test - no ECHO - no Cardiac Cath - no Pacemaker/ICD device last checked:NA  Sleep Study - no CPAP -   Fasting Blood Sugar - 121-245 Checks Blood Sugar _____ times a day 2-3 times a week  Blood Thinner Instructions:NA Aspirin Instructions: Last Dose:  Anesthesia review: no  Patient denies shortness of breath, fever, cough and chest pain at PAT appointment Yes.  Pt reports No SOB with any activities. Her BMI is 55.6  Patient verbalized understanding of instructions that were given to them at the PAT appointment. Patient was also instructed that they will need to review over the PAT instructions again at home before surgery. yes

## 2021-02-08 LAB — HEMOGLOBIN A1C
Hgb A1c MFr Bld: 7.7 % — ABNORMAL HIGH (ref 4.8–5.6)
Mean Plasma Glucose: 174 mg/dL

## 2021-02-10 ENCOUNTER — Other Ambulatory Visit (HOSPITAL_COMMUNITY): Payer: Medicaid Other

## 2021-02-15 NOTE — H&P (Signed)
REFERRING PHYSICIAN:  York Cerise, MD   PROVIDER:  Wayne Both, MD   MRN: [id number] DOB: [birth year] DATE OF ENCOUNTER: 01/30/2021   Subjective    Chief Complaint: Breast Problem (Hidradenitis)       History of Present Illness: Heidi Berry is a 40 y.o. female who is seen today as an office consultation at the request of Dr. Earnest Bailey for evaluation of Breast Problem (Hidradenitis) .     This is a very pleasant 40 year old female who is referred here for evaluation of hidradenitis.  Her biggest complaint currently is areas underneath both her breasts medially.  She has chronic abscesses which will continually recur and drained.  She has had similar areas in the axilla and in the groins as well but these have improved significantly.  The areas in the breast are her biggest concern.  She is currently not on antibiotics.  She is otherwise without complaints.     Review of Systems: A complete review of systems was obtained from the patient.  I have reviewed this information and discussed as appropriate with the patient.  See HPI as well for other ROS.   ROS      Medical History: Past Medical History      Past Medical History:  Diagnosis Date   Anemia     Anxiety     Diabetes mellitus without complication (CMS-HCC)     GERD (gastroesophageal reflux disease)     Hypertension             Patient Active Problem List  Diagnosis   Anemia   Back pain   Bilateral ankle pain   BMI 50.0-59.9, adult (CMS-HCC)   Chronic diarrhea, unspecified   Dandruff   Encounter for screening involving social determinants of health (SDoH)   Gastroesophageal reflux disease without esophagitis   Generalized anxiety disorder   Genital HSV   Hypertension   IBS (irritable bowel syndrome)   MDD (major depressive disorder)   Migraine headache with aura   Nausea without vomiting   Patellofemoral pain syndrome of right knee   Skin lesion   Type 2 diabetes mellitus without  complication (CMS-HCC)      Past Surgical History       Past Surgical History:  Procedure Laterality Date   CHOLECYSTECTOMY            Allergies  No Known Allergies           Current Outpatient Medications on File Prior to Visit  Medication Sig Dispense Refill   acetaminophen (TYLENOL) 500 MG tablet Take by mouth       escitalopram oxalate (LEXAPRO) 10 MG tablet Take 10 mg by mouth once daily       ferrous sulfate 325 (65 FE) MG EC tablet Take by mouth       ibuprofen (MOTRIN) 800 MG tablet Take by mouth every 6 (six) hours as needed       losartan (COZAAR) 50 MG tablet Take 1 tablet by mouth once daily       metFORMIN (GLUCOPHAGE-XR) 500 MG XR tablet Take by mouth       methocarbamoL (ROBAXIN) 500 MG tablet Take 500 mg by mouth 2 (two) times daily       norethindrone (MICRONOR) 0.35 mg tablet Take by mouth       ranitidine (ZANTAC) 150 MG tablet Take 150 mg by mouth 2 (two) times daily       valACYclovir (VALTREX) 1000 MG tablet Take by mouth  No current facility-administered medications on file prior to visit.      Family History  History reviewed. No pertinent family history.      Social History       Tobacco Use  Smoking Status Never Smoker  Smokeless Tobacco Never Used      Social History  Social History        Socioeconomic History   Marital status: Single  Tobacco Use   Smoking status: Never Smoker   Smokeless tobacco: Never Used  Building services engineer Use: Never used  Substance and Sexual Activity   Alcohol use: Yes   Drug use: Never        Objective:         Vitals:    01/30/21 1605  BP: (!) 150/90  Pulse: (!) 120  Temp: 36.7 C (98 F)  SpO2: 96%  Weight: (!) 152.2 kg (335 lb 9.6 oz)  Height: 165.1 cm (5\' 5" )    Body mass index is 55.85 kg/m.   Physical Exam    She appears well on exam.   She has chronic hidradenitis on both her breasts at the inframammary ridge medially.  There is erythema and some very thin purulent  drainage from the left side but none from the right.  I was able to express some the fluid from the left.   She has chronic skin changes around the breast underneath both sides.  Lungs clear  CV RRR  Abdomen soft, NT     Labs, Imaging and Diagnostic Testing:     Assessment and Plan:  Diagnoses and all orders for this visit:   Hidradenitis     I have reviewed her notes in the electronic medical records. We had a long discussion regarding hidradenitis.  She understands this occurs in sweat glands and is not a curable disease and surgery is limited to the areas of continued drainage and infection.  I would recommend wide excision of the area of both breasts.  This would allow histologic evaluation as well to rule out any other issues.  I discussed this with her in detail.  We discussed the risk of surgery which includes but is not limited to bleeding, ongoing infection, having chronic open wounds, recurrence, etc.  I will start her on antibiotics preoperatively as well.  She understands and wishes to proceed with surgery as soon as possible.

## 2021-02-16 ENCOUNTER — Encounter (HOSPITAL_COMMUNITY)
Admission: RE | Disposition: A | Discharge status: Home / Self Care | Payer: Self-pay | Source: Home / Self Care | Attending: Surgery

## 2021-02-16 ENCOUNTER — Ambulatory Visit (HOSPITAL_COMMUNITY): Payer: Medicaid Other | Admitting: Certified Registered"

## 2021-02-16 ENCOUNTER — Ambulatory Visit (HOSPITAL_COMMUNITY)
Admission: RE | Admit: 2021-02-16 | Discharge: 2021-02-16 | Disposition: A | Discharge status: Home / Self Care | Payer: Medicaid Other | Attending: Surgery | Admitting: Surgery

## 2021-02-16 ENCOUNTER — Encounter (HOSPITAL_COMMUNITY): Payer: Self-pay | Admitting: Surgery

## 2021-02-16 DIAGNOSIS — Z79899 Other long term (current) drug therapy: Secondary | ICD-10-CM | POA: Diagnosis not present

## 2021-02-16 DIAGNOSIS — E119 Type 2 diabetes mellitus without complications: Secondary | ICD-10-CM | POA: Diagnosis not present

## 2021-02-16 DIAGNOSIS — L732 Hidradenitis suppurativa: Secondary | ICD-10-CM | POA: Insufficient documentation

## 2021-02-16 DIAGNOSIS — Z6841 Body Mass Index (BMI) 40.0 and over, adult: Secondary | ICD-10-CM | POA: Insufficient documentation

## 2021-02-16 DIAGNOSIS — Z7984 Long term (current) use of oral hypoglycemic drugs: Secondary | ICD-10-CM | POA: Insufficient documentation

## 2021-02-16 DIAGNOSIS — Z791 Long term (current) use of non-steroidal anti-inflammatories (NSAID): Secondary | ICD-10-CM | POA: Diagnosis not present

## 2021-02-16 HISTORY — PX: HYDRADENITIS EXCISION: SHX5243

## 2021-02-16 LAB — GLUCOSE, CAPILLARY
Glucose-Capillary: 170 mg/dL — ABNORMAL HIGH (ref 70–99)
Glucose-Capillary: 128 mg/dL — ABNORMAL HIGH (ref 70–99)

## 2021-02-16 LAB — PREGNANCY, URINE: Preg Test, Ur: NEGATIVE

## 2021-02-16 SURGERY — EXCISION, HIDRADENITIS, AXILLA
Anesthesia: General | Site: Breast | Laterality: Right

## 2021-02-16 MED ORDER — CHLORHEXIDINE GLUCONATE CLOTH 2 % EX PADS: 6.0000 | MEDICATED_PAD | Freq: Once | CUTANEOUS | Status: DC

## 2021-02-16 MED ORDER — OXYCODONE HCL 5 MG/5ML PO SOLN: 5.0000 mg | Freq: Once | ORAL | Status: DC | PRN

## 2021-02-16 MED ORDER — BUPIVACAINE HCL (PF) 0.5 % IJ SOLN
INTRAMUSCULAR | Status: DC | PRN
  Administered 2021-02-16: 20 mL

## 2021-02-16 MED ORDER — ONDANSETRON HCL 4 MG/2ML IJ SOLN
INTRAMUSCULAR | Status: DC | PRN
  Administered 2021-02-16: 4 mg via INTRAVENOUS

## 2021-02-16 MED ORDER — BUPIVACAINE HCL (PF) 0.5 % IJ SOLN
INTRAMUSCULAR | Status: AC
  Filled 2021-02-16: qty 30

## 2021-02-16 MED ORDER — CEFAZOLIN IN SODIUM CHLORIDE 3-0.9 GM/100ML-% IV SOLN
3.0000 g | INTRAVENOUS | Status: AC
  Administered 2021-02-16: 3 g via INTRAVENOUS
  Filled 2021-02-16: qty 100

## 2021-02-16 MED ORDER — OXYCODONE HCL 5 MG PO TABS: 5.0000 mg | ORAL_TABLET | Freq: Once | ORAL | Status: DC | PRN

## 2021-02-16 MED ORDER — FENTANYL CITRATE (PF) 250 MCG/5ML IJ SOLN
INTRAMUSCULAR | Status: DC | PRN
  Administered 2021-02-16: 25 ug via INTRAVENOUS
  Administered 2021-02-16 (×2): 50 ug via INTRAVENOUS
  Administered 2021-02-16: 25 ug via INTRAVENOUS

## 2021-02-16 MED ORDER — ORAL CARE MOUTH RINSE: 15.0000 mL | Freq: Once | OROMUCOSAL | Status: AC

## 2021-02-16 MED ORDER — PROPOFOL 10 MG/ML IV BOLUS
INTRAVENOUS | Status: AC
  Filled 2021-02-16: qty 20

## 2021-02-16 MED ORDER — DOXYCYCLINE MONOHYDRATE 100 MG PO CAPS: 100.0000 mg | ORAL_CAPSULE | Freq: Two times a day (BID) | ORAL | 1 refills | Status: DC

## 2021-02-16 MED ORDER — HYDROMORPHONE HCL 1 MG/ML IJ SOLN: 0.2500 mg | INTRAMUSCULAR | Status: DC | PRN

## 2021-02-16 MED ORDER — LIDOCAINE 2% (20 MG/ML) 5 ML SYRINGE
INTRAMUSCULAR | Status: DC | PRN
  Administered 2021-02-16: 100 mg via INTRAVENOUS

## 2021-02-16 MED ORDER — DEXAMETHASONE SODIUM PHOSPHATE 10 MG/ML IJ SOLN
INTRAMUSCULAR | Status: AC
  Filled 2021-02-16: qty 1

## 2021-02-16 MED ORDER — ACETAMINOPHEN 500 MG PO TABS
1000.0000 mg | ORAL_TABLET | ORAL | Status: AC
  Administered 2021-02-16: 1000 mg via ORAL
  Filled 2021-02-16: qty 2

## 2021-02-16 MED ORDER — PROMETHAZINE HCL 25 MG/ML IJ SOLN: 6.2500 mg | INTRAMUSCULAR | Status: DC | PRN

## 2021-02-16 MED ORDER — DEXAMETHASONE SODIUM PHOSPHATE 10 MG/ML IJ SOLN
INTRAMUSCULAR | Status: DC | PRN
  Administered 2021-02-16: 8 mg via INTRAVENOUS

## 2021-02-16 MED ORDER — OXYCODONE HCL 5 MG PO TABS: 5.0000 mg | ORAL_TABLET | Freq: Four times a day (QID) | ORAL | 0 refills | Status: DC | PRN

## 2021-02-16 MED ORDER — MIDAZOLAM HCL 5 MG/5ML IJ SOLN
INTRAMUSCULAR | Status: DC | PRN
  Administered 2021-02-16: 2 mg via INTRAVENOUS

## 2021-02-16 MED ORDER — PROPOFOL 10 MG/ML IV BOLUS
INTRAVENOUS | Status: DC | PRN
  Administered 2021-02-16: 200 mg via INTRAVENOUS

## 2021-02-16 MED ORDER — ONDANSETRON HCL 4 MG/2ML IJ SOLN
INTRAMUSCULAR | Status: AC
  Filled 2021-02-16: qty 2

## 2021-02-16 MED ORDER — FENTANYL CITRATE (PF) 250 MCG/5ML IJ SOLN
INTRAMUSCULAR | Status: AC
  Filled 2021-02-16: qty 5

## 2021-02-16 MED ORDER — AMISULPRIDE (ANTIEMETIC) 5 MG/2ML IV SOLN: 10.0000 mg | Freq: Once | INTRAVENOUS | Status: DC | PRN

## 2021-02-16 MED ORDER — 0.9 % SODIUM CHLORIDE (POUR BTL) OPTIME
TOPICAL | Status: DC | PRN
  Administered 2021-02-16: 1000 mL

## 2021-02-16 MED ORDER — LACTATED RINGERS IV SOLN: INTRAVENOUS | Status: DC

## 2021-02-16 MED ORDER — CHLORHEXIDINE GLUCONATE 0.12 % MT SOLN
15.0000 mL | Freq: Once | OROMUCOSAL | Status: AC
  Administered 2021-02-16: 15 mL via OROMUCOSAL

## 2021-02-16 MED ORDER — MEPERIDINE HCL 50 MG/ML IJ SOLN: 6.2500 mg | INTRAMUSCULAR | Status: DC | PRN

## 2021-02-16 SURGICAL SUPPLY — 34 items
BAG COUNTER SPONGE SURGICOUNT (BAG) IMPLANT
BLADE SURG 15 STRL LF DISP TIS (BLADE) ×3 IMPLANT
BLADE SURG 15 STRL SS (BLADE) ×3
BNDG ELASTIC 6X5.8 VLCR STR LF (GAUZE/BANDAGES/DRESSINGS) ×2 IMPLANT
CLEANER TIP ELECTROSURG 2X2 (MISCELLANEOUS) ×2 IMPLANT
DECANTER SPIKE VIAL GLASS SM (MISCELLANEOUS) ×2 IMPLANT
DERMABOND ADVANCED (GAUZE/BANDAGES/DRESSINGS)
DERMABOND ADVANCED .7 DNX12 (GAUZE/BANDAGES/DRESSINGS) IMPLANT
DRAPE LAPAROTOMY TRNSV 102X78 (DRAPES) ×2 IMPLANT
ELECT COATED BLADE 2.86 ST (ELECTRODE) ×2 IMPLANT
ELECT REM PT RETURN 15FT ADLT (MISCELLANEOUS) ×2 IMPLANT
GAUZE 4X4 16PLY ~~LOC~~+RFID DBL (SPONGE) ×2 IMPLANT
GAUZE SPONGE 4X4 12PLY STRL (GAUZE/BANDAGES/DRESSINGS) ×2 IMPLANT
GLOVE SURG ENC MOIS LTX SZ7 (GLOVE) ×4 IMPLANT
GLOVE SURG ENC MOIS LTX SZ7.5 (GLOVE) ×2 IMPLANT
GLOVE SURG UNDER POLY LF SZ7 (GLOVE) ×2 IMPLANT
GOWN STRL REUS W/TWL LRG LVL3 (GOWN DISPOSABLE) ×2 IMPLANT
GOWN STRL REUS W/TWL XL LVL3 (GOWN DISPOSABLE) ×4 IMPLANT
KIT BASIN OR (CUSTOM PROCEDURE TRAY) ×2 IMPLANT
KIT TURNOVER KIT A (KITS) ×2 IMPLANT
MARKER SKIN DUAL TIP RULER LAB (MISCELLANEOUS) ×2 IMPLANT
NEEDLE HYPO 22GX1.5 SAFETY (NEEDLE) ×2 IMPLANT
NEEDLE HYPO 25X1 1.5 SAFETY (NEEDLE) ×2 IMPLANT
PACK BASIC VI WITH GOWN DISP (CUSTOM PROCEDURE TRAY) ×2 IMPLANT
PENCIL SMOKE EVACUATOR (MISCELLANEOUS) IMPLANT
SOL PREP POV-IOD 4OZ 10% (MISCELLANEOUS) ×2 IMPLANT
SPONGE T-LAP 4X18 ~~LOC~~+RFID (SPONGE) ×2 IMPLANT
STRIP CLOSURE SKIN 1/2X4 (GAUZE/BANDAGES/DRESSINGS) ×4 IMPLANT
SUT ETHILON 2 0 PS N (SUTURE) ×2 IMPLANT
SUT VIC AB 3-0 SH 27 (SUTURE)
SUT VIC AB 3-0 SH 27XBRD (SUTURE) IMPLANT
SYR BULB IRRIG 60ML STRL (SYRINGE) ×2 IMPLANT
SYR CONTROL 10ML LL (SYRINGE) ×2 IMPLANT
TOWEL OR 17X26 10 PK STRL BLUE (TOWEL DISPOSABLE) ×2 IMPLANT

## 2021-02-16 NOTE — Anesthesia Postprocedure Evaluation (Signed)
Anesthesia Post Note  Patient: Heidi Berry  Procedure(s) Performed: WIDE EXCISION HIDRADENITIS RIGHT BREAST (Right: Breast)     Patient location during evaluation: PACU Anesthesia Type: General Level of consciousness: awake and alert Pain management: pain level controlled Vital Signs Assessment: post-procedure vital signs reviewed and stable Respiratory status: spontaneous breathing, nonlabored ventilation and respiratory function stable Cardiovascular status: blood pressure returned to baseline and stable Postop Assessment: no apparent nausea or vomiting Anesthetic complications: no   No notable events documented.  Last Vitals:  Vitals:   02/16/21 1315 02/16/21 1330  BP: 138/87 (!) 141/77  Pulse: 74 83  Resp: 15 15  Temp:  36.5 C  SpO2: 100% 99%    Last Pain:  Vitals:   02/16/21 1330  TempSrc:   PainSc: 0-No pain                 Lowella Curb

## 2021-02-16 NOTE — Transfer of Care (Signed)
Immediate Anesthesia Transfer of Care Note  Patient: Heidi Berry  Procedure(s) Performed: WIDE EXCISION HIDRADENITIS RIGHT BREAST (Right: Breast)  Patient Location: PACU  Anesthesia Type:General  Level of Consciousness: awake, alert  and oriented  Airway & Oxygen Therapy: Patient Spontanous Breathing and Patient connected to face mask oxygen  Post-op Assessment: Report given to RN, Post -op Vital signs reviewed and stable and Patient moving all extremities  Post vital signs: Reviewed and stable  Last Vitals:  Vitals Value Taken Time  BP 142/80 02/16/21 1302  Temp    Pulse 85 02/16/21 1304  Resp 22 02/16/21 1304  SpO2 100 % 02/16/21 1304  Vitals shown include unvalidated device data.  Last Pain:  Vitals:   02/16/21 1054  TempSrc:   PainSc: 0-No pain         Complications: No notable events documented.

## 2021-02-16 NOTE — Anesthesia Preprocedure Evaluation (Addendum)
Anesthesia Evaluation  Patient identified by MRN, date of birth, ID band Patient awake    Reviewed: Allergy & Precautions, NPO status , Patient's Chart, lab work & pertinent test results  Airway Mallampati: II  TM Distance: >3 FB Neck ROM: Full    Dental no notable dental hx.    Pulmonary neg pulmonary ROS,    Pulmonary exam normal breath sounds clear to auscultation       Cardiovascular hypertension, Pt. on medications negative cardio ROS Normal cardiovascular exam Rhythm:Regular Rate:Normal  ECG: SR, rate 87   Neuro/Psych  Headaches, PSYCHIATRIC DISORDERS Anxiety Depression negative neurological ROS  negative psych ROS   GI/Hepatic negative GI ROS, Neg liver ROS, GERD  Medicated,  Endo/Other  negative endocrine ROSdiabetes, Oral Hypoglycemic AgentsMorbid obesity  Renal/GU negative Renal ROS  negative genitourinary   Musculoskeletal negative musculoskeletal ROS (+)   Abdominal (+) + obese,   Peds negative pediatric ROS (+)  Hematology negative hematology ROS (+) anemia ,   Anesthesia Other Findings HIDRADENITIS  Reproductive/Obstetrics negative OB ROS hcg negative                            Anesthesia Physical Anesthesia Plan  ASA: 3  Anesthesia Plan: General   Post-op Pain Management:    Induction: Intravenous  PONV Risk Score and Plan: 3 and Ondansetron, Dexamethasone, Midazolam and Treatment may vary due to age or medical condition  Airway Management Planned: LMA  Additional Equipment:   Intra-op Plan:   Post-operative Plan: Extubation in OR  Informed Consent: I have reviewed the patients History and Physical, chart, labs and discussed the procedure including the risks, benefits and alternatives for the proposed anesthesia with the patient or authorized representative who has indicated his/her understanding and acceptance.     Dental advisory given  Plan Discussed  with: CRNA  Anesthesia Plan Comments:        Anesthesia Quick Evaluation

## 2021-02-16 NOTE — Anesthesia Procedure Notes (Signed)
Procedure Name: LMA Insertion Date/Time: 02/16/2021 12:34 PM Performed by: Lucinda Dell, CRNA Pre-anesthesia Checklist: Patient identified, Emergency Drugs available, Suction available and Patient being monitored Patient Re-evaluated:Patient Re-evaluated prior to induction Preoxygenation: Pre-oxygenation with 100% oxygen Induction Type: IV induction Ventilation: Mask ventilation without difficulty LMA: LMA inserted LMA Size: 4.0 Number of attempts: 1 Placement Confirmation: positive ETCO2 and breath sounds checked- equal and bilateral Tube secured with: Tape Dental Injury: Teeth and Oropharynx as per pre-operative assessment

## 2021-02-16 NOTE — Interval H&P Note (Signed)
History and Physical Interval Note:  she is doing well.  The left breast area of hidradenitis has improved significantly with oral and topical antibiotics so we will just excise the right breast hidradenitis.  She agrees with the plan  02/16/2021 10:52 AM  Heidi Berry  has presented today for surgery, with the diagnosis of HIDRADENITIS.  The various methods of treatment have been discussed with the patient and family. After consideration of risks, benefits and other options for treatment, the patient has consented to  Procedure(s): WIDE EXCISION HIDRADENITIS BILATERAL BREASTS (Bilateral) as a surgical intervention.  The patient's history has been reviewed, patient examined, no change in status, stable for surgery.  I have reviewed the patient's chart and labs.  Questions were answered to the patient's satisfaction.     Abigail Miyamoto

## 2021-02-16 NOTE — Op Note (Signed)
WIDE EXCISION HIDRADENITIS RIGHT BREAST  Procedure Note  Aster Screws 02/16/2021   Pre-op Diagnosis: HIDRADENITIS     Post-op Diagnosis: same  Procedure(s): WIDE EXCISION HIDRADENITIS RIGHT BREAST (14 cm square )  Surgeon(s): Abigail Miyamoto, MD  Anesthesia: General  Staff:  Circulator: Katherene Ponto, RN Scrub Person: Fredricka Bonine L  Estimated Blood Loss: Minimal               Specimens: sent to path  Indications: This is a 40 year old female with hidradenitis.  She has had multiple locations.  Her biggest area of concern today is her right breast.  The decision was made to proceed with a wide excision  Findings: 14 cm of skin and subcutaneous tissue was excised on the inframammary fold of the right breast  Procedure: The patient was brought to operating identifies correct patient.  She is placed upon the operating table general anesthesia was induced.  Her right breast and chest were prepped and draped in usual sterile fashion.  I anesthetized the skin around the area of the active hidradenitis in the lower inner quadrant of the right breast with Marcaine.  I then performed a wide elliptical incision around the 3 chronically draining sinus tracts with a scalpel.  I then took this down to the breast tissue electrocautery.  I then completed the wide excision with the cautery.  14 cm of skin and subcutaneous tissue including the hidradenitis was excised.  This was sent to pathology for evaluation.  I achieved hemostasis with the cautery.  We irrigated the wound with saline.  I anesthetized it further with Marcaine.  The incision was then closed with interrupted 2-0 nylon sutures.  Gauze and tape were then applied.  The patient tolerated the procedure well.  All the counts were correct at the end of the procedure.  The patient was then extubated in the operating room and taken in stable condition to the recovery room.          Abigail Miyamoto   Date: 02/16/2021  Time:  12:55 PM

## 2021-02-16 NOTE — Discharge Instructions (Signed)
Ok to shower starting tomorrow  No vigorous activity for 2 weeks  Ice pack, tylenol, and ibuprofen also for pain  Cover with a dry gauze.  Expect the wound to drain

## 2021-02-17 ENCOUNTER — Encounter (HOSPITAL_COMMUNITY): Payer: Self-pay | Admitting: Surgery

## 2021-02-17 LAB — SURGICAL PATHOLOGY

## 2021-02-27 ENCOUNTER — Ambulatory Visit: Payer: Medicaid Other | Admitting: Obstetrics and Gynecology

## 2022-02-18 IMAGING — CT CT ANGIO CHEST
2 of 4 series · 18 of 36 positions shown · IV contrast (omnipaque)
Comparison: Chest radiograph 10/02/2020

CLINICAL DATA: Pulmonary embolus suspected with low to intermediate
probability. Positive D-dimer. Right-sided chest pain since last
night.

EXAM:
CT ANGIOGRAPHY CHEST WITH CONTRAST
TECHNIQUE: Multidetector CT imaging of the chest was performed using the
standard protocol during bolus administration of intravenous
contrast. Multiplanar CT image reconstructions and MIPs were
obtained to evaluate the vascular anatomy.
CONTRAST:  100mL OMNIPAQUE IOHEXOL 350 MG/ML SOLN

[Series 4: axial st · axial · 0.73mm/px · z∈[-280,-30]mm · 15 of 135 slices shown]
[im 5/135  lung]
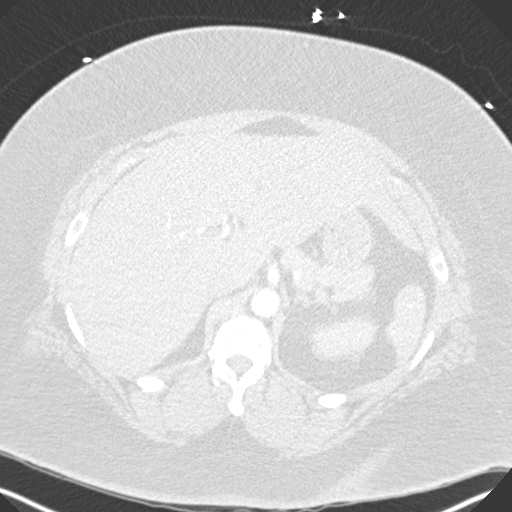
[im 15/135  mediastinal]
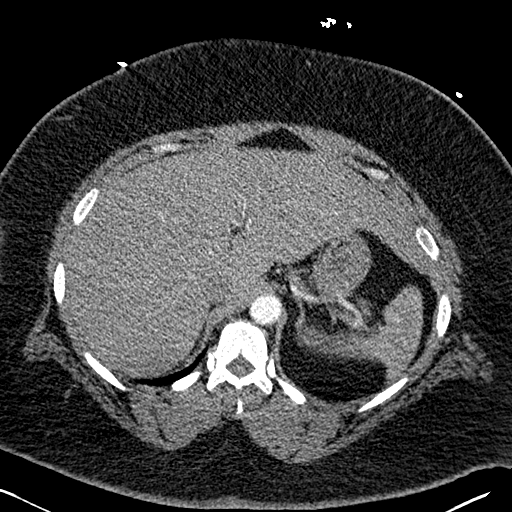
[im 25/135  lung]
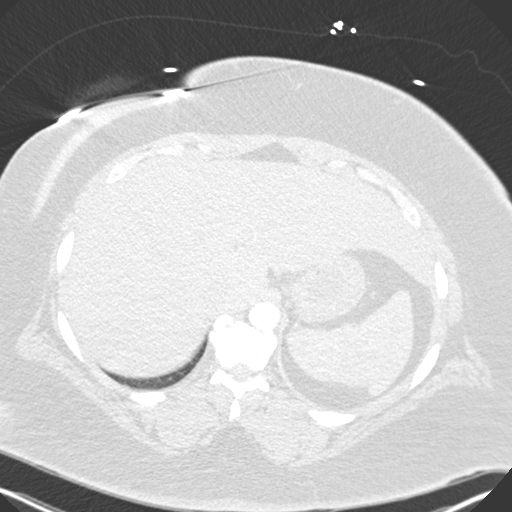
[im 35/135  mediastinal]
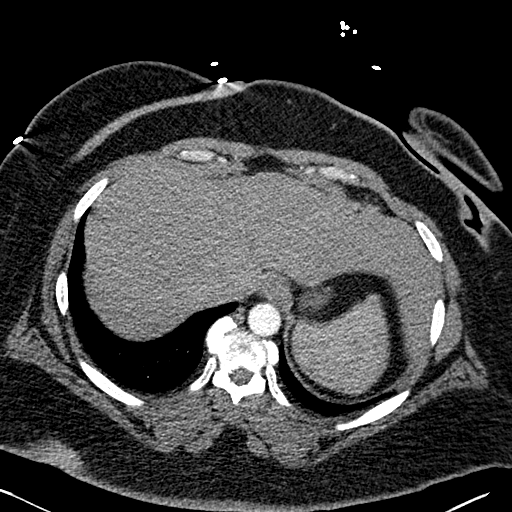
[im 40/135  lung]
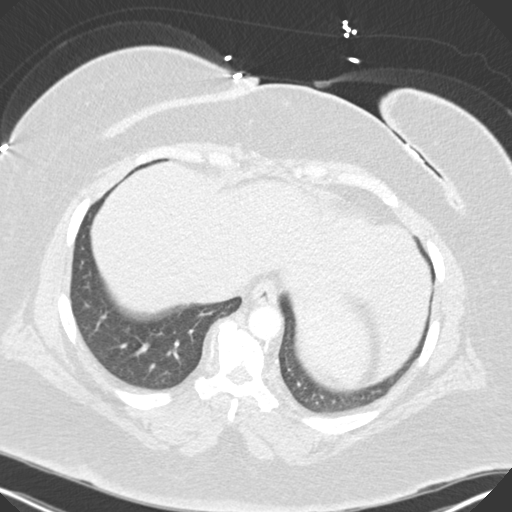
[im 50/135  mediastinal]
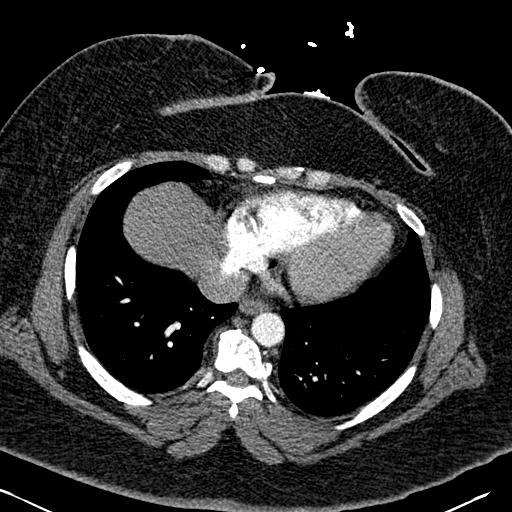
[im 60/135  lung]
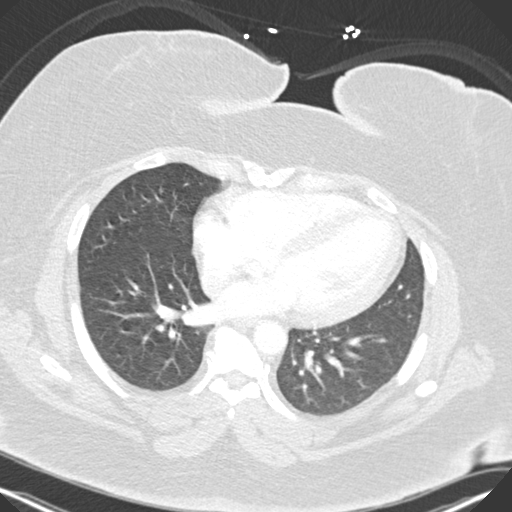
[im 70/135  mediastinal]
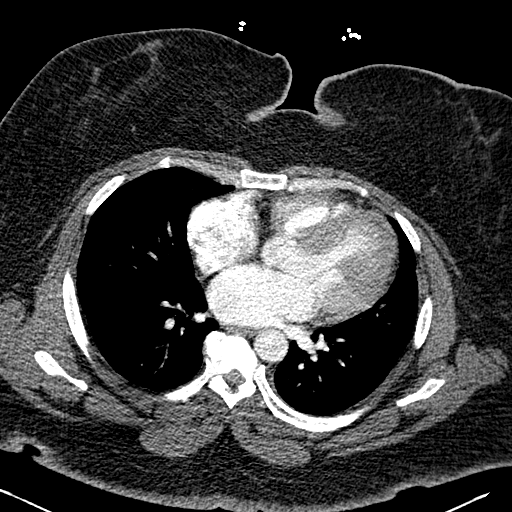
[im 75/135  lung]
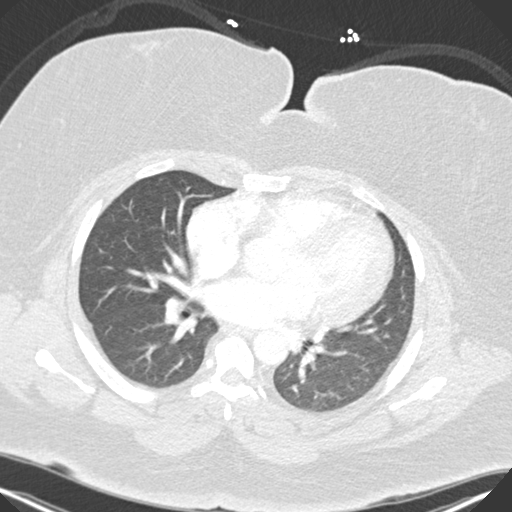
[im 85/135  mediastinal]
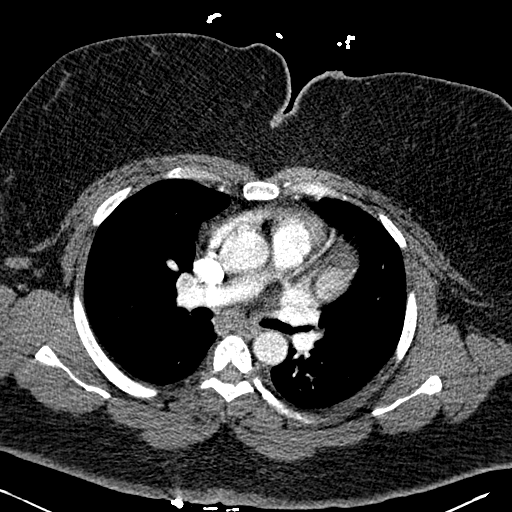
[im 95/135  lung]
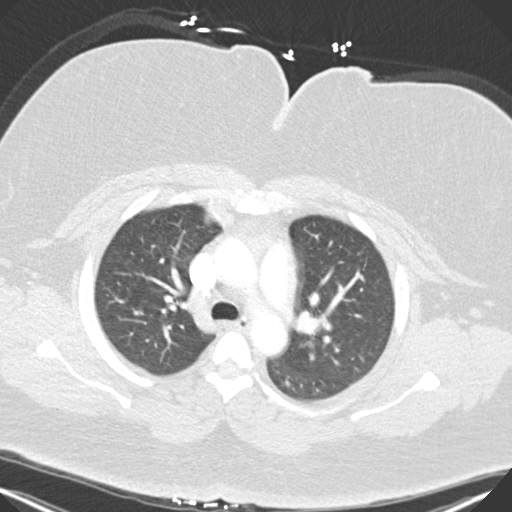
[im 100/135  mediastinal]
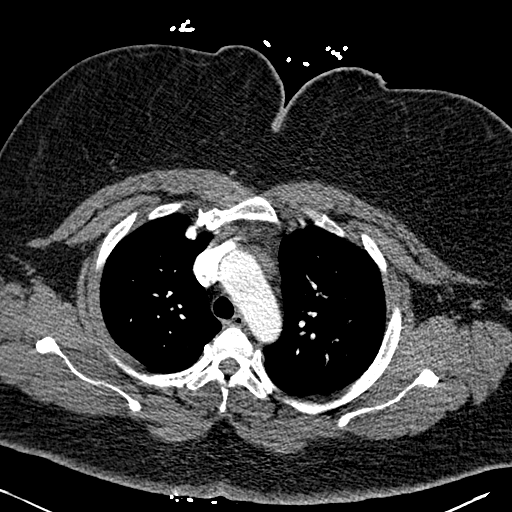
[im 110/135  lung]
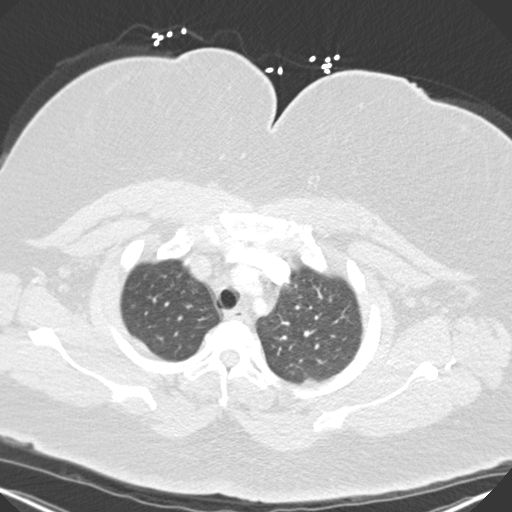
[im 120/135  mediastinal]
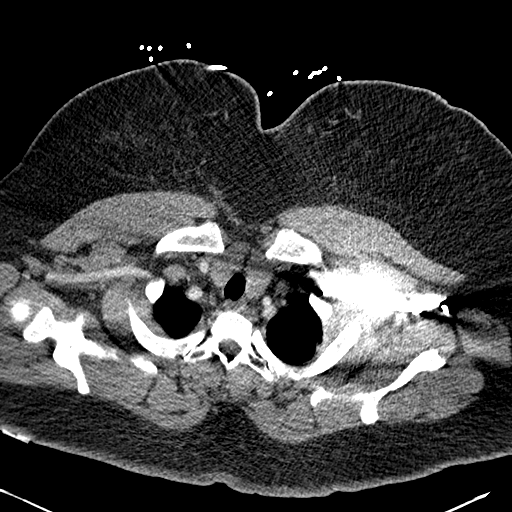
[im 130/135  lung]
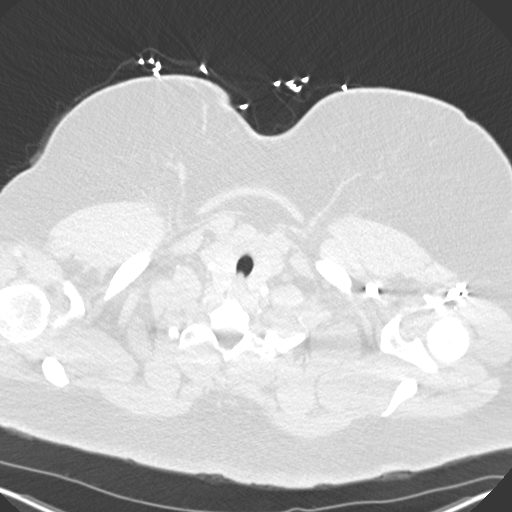

[Series 7: coronal mpr · coronal · 0.61mm/px · 3 of 163 slices shown]
[im 33/163  mediastinal]
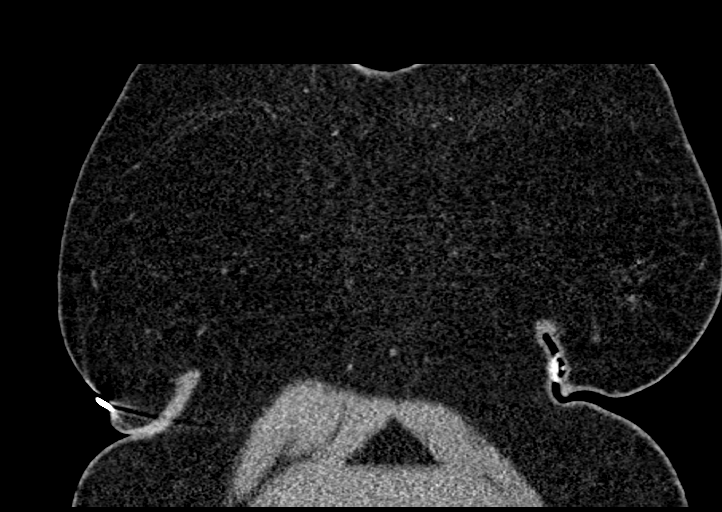
[im 65/163  mediastinal]
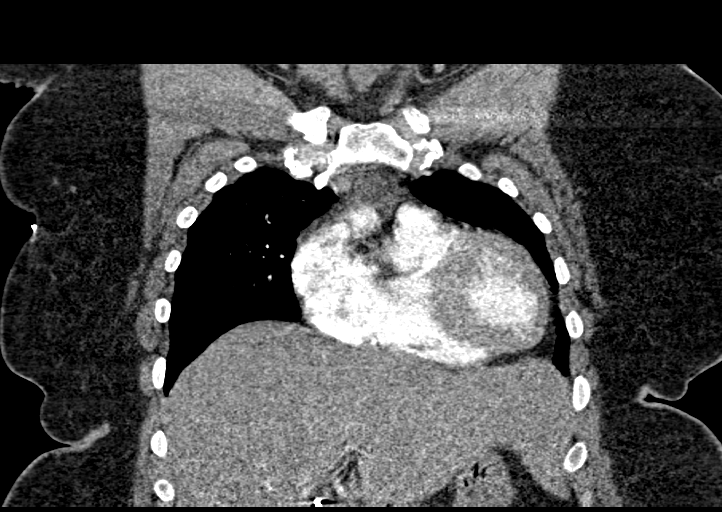
[im 98/163  mediastinal]
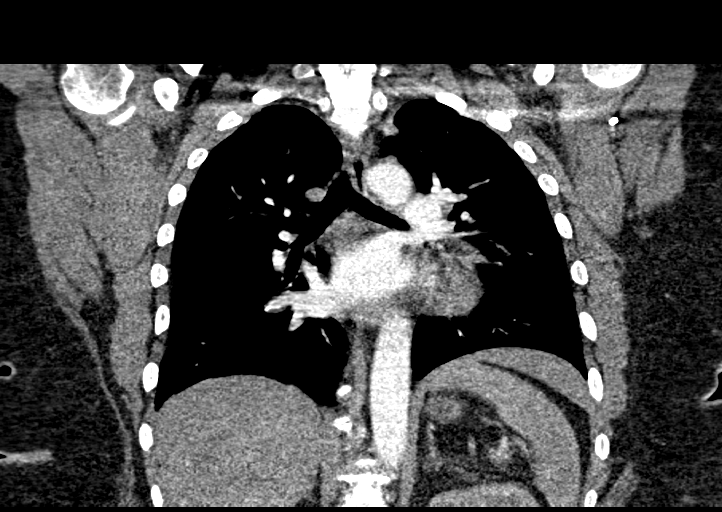

[18 of 36 positions shown; findings below may reference images not displayed]

FINDINGS: Cardiovascular: Moderately good opacification of the central and
proximal segmental pulmonary arteries. No obvious filling defect is
identified suggesting no evidence of significant pulmonary embolus.
More peripheral vessels may be obscured. Normal heart size. No
pericardial effusions. Normal caliber thoracic aorta. No aortic
dissection. Great vessel origins are patent.

Mediastinum/Nodes: Esophagus is decompressed. No significant
lymphadenopathy. Thyroid gland is unremarkable.

Lungs/Pleura: Lungs are clear. No pleural effusions. No
pneumothorax.

Upper Abdomen: No acute abnormalities demonstrated in the visualized
upper abdomen.

Musculoskeletal: Degenerative changes in the spine. No destructive
bone lesions.

Review of the MIP images confirms the above findings.
IMPRESSION: 1. No evidence of significant pulmonary embolus.
2. No evidence of active pulmonary disease.

## 2022-10-30 NOTE — Telephone Encounter (Signed)
PT is calling to be able to schedule colonoscopy at [facility]. She wants to speak with a nurse as far as her options. Please advise.

## 2022-10-30 NOTE — Telephone Encounter (Signed)
The pt has been advised that she will need to call and make an office visit to discuss. She has not been seen in 2 years and is a previous patient of Dr Christella Hartigan.

## 2022-11-02 ENCOUNTER — Telehealth: Payer: Self-pay | Admitting: Gastroenterology

## 2022-11-02 NOTE — Telephone Encounter (Signed)
Inbound call from patient, wishing to schedule an appointment to discuss a colonoscopy. Patient was previously a Dr. Christella Hartigan patient and was scheduled for 8/21 at 8:50 AM. Patient is requesting for visit to be virtual due to issues with transportation. Please advise if visit can be virtual.

## 2023-01-29 ENCOUNTER — Telehealth: Payer: Self-pay

## 2023-01-29 NOTE — Telephone Encounter (Signed)
Called patient and went through medication list and history for mychart video visit on 8/21

## 2023-01-30 ENCOUNTER — Encounter: Payer: Self-pay | Admitting: Gastroenterology

## 2023-01-30 ENCOUNTER — Telehealth (INDEPENDENT_AMBULATORY_CARE_PROVIDER_SITE_OTHER): Payer: Medicaid Other | Admitting: Gastroenterology

## 2023-01-30 DIAGNOSIS — K580 Irritable bowel syndrome with diarrhea: Secondary | ICD-10-CM | POA: Diagnosis not present

## 2023-01-30 DIAGNOSIS — D649 Anemia, unspecified: Secondary | ICD-10-CM | POA: Diagnosis not present

## 2023-01-30 MED ORDER — DICYCLOMINE HCL 20 MG PO TABS: 20.0000 mg | ORAL_TABLET | Freq: Four times a day (QID) | ORAL | 3 refills | Status: DC

## 2023-01-30 MED ORDER — CHOLESTYRAMINE 4 G PO PACK: 4.0000 g | PACK | Freq: Two times a day (BID) | ORAL | 3 refills | Status: AC

## 2023-01-30 MED ORDER — CHOLESTYRAMINE 4 G PO PACK: 4.0000 g | PACK | Freq: Three times a day (TID) | ORAL | 3 refills | Status: DC

## 2023-01-30 NOTE — Patient Instructions (Addendum)
Your provider has requested that you go to the basement level for lab work before leaving today. Press "B" on the elevator. The lab is located at the first door on the left as you exit the elevator.  We have sent the following medications to your pharmacy for you to pick up at your convenience: Bentyl 20 mg , Questran 4 grams.  If your blood pressure at your visit was 140/90 or greater, please contact your primary care physician to follow up on this.  _______________________________________________________  If you are age 43 or older, your body mass index should be between 23-30. Your There is no height or weight on file to calculate BMI. If this is out of the aforementioned range listed, please consider follow up with your Primary Care Provider.  If you are age 68 or younger, your body mass index should be between 19-25. Your There is no height or weight on file to calculate BMI. If this is out of the aformentioned range listed, please consider follow up with your Primary Care Provider.    The [place] [facility] providers would like to encourage you to use [facility] to communicate with providers for non-urgent requests or questions.  Due to long hold times on the telephone, sending your provider a message by [facility] may be a faster and more efficient way to get a response.  Please allow 48 business hours for a response.  Please remember that this is for non-urgent requests.   It was a pleasure to see you today!  Thank you for trusting me with your gastrointestinal care!    Scott E.Tomasa Rand, MD

## 2023-01-30 NOTE — Progress Notes (Signed)
Patient ID: Heidi Berry is a 42 y.o. (DOB 10-Nov-1980) female Place of service: patient home Patient has been advised as to the limitations and limited nature of physical exam due to nature of a video visit, the possibility of privacy risk in the use of a video visit, and that the healthcare provider may recommend visiting a healthcare clinic for in-person care and follow up.     HPI : Heidi Berry is a 42 y.o. female with a history of morbid obesity, diabetes, anxiety/depression, hidradenitis and chronic diarrhea who presents for virtual visit to discuss having a colonoscopy.  When the virtual visit was scheduled, she stated that she did not have any GI complaints or symptoms.  Today, the patient states that she is bothered by significant diarrhea, abdominal pain and fatigue.  She has had these symptoms for over 10 years. She reports that she has 4 bowel movements per day on average.  Her stools are always mushy or watery in consistency.  She almost never has a formed stool.  She has significant fecal urgency.  The symptoms significantly impact her quality of life and social life.  They also affect her ability to effectively work some days.  She denies any episodes of fecal incontinence. She has crampy pain which accompanies the urge to defecate.  She also states that she feels significantly fatigued and exhausted after bowel movements.  She does take Imodium when she will be eating out, or will not have reliable access to a bathroom.  This does help reduce her stool frequency and urgency.  She was last seen in our clinic in 2022 and the patient states that her symptoms are about the same as what they were back then.  She had been scheduled for an EGD and a colonoscopy but these procedures were cancelled due to an ED visit for chest pain the week before.  She had a colonoscopy in her 27s in [place] which she believes was normal.  She has not noted any dietary triggers for her symptoms.   She does not use any artificial sweeteners or eat foods with sugar substitutes. She had her gallbladder out in 2010.  She was prescribed questran in 2010 which she said helped, but she ran out and did not request a refill.  She  reports having chronic anemia since she was in high school She is taking oral iron 325 mg daily She has not had any menses for the past 2 years following a Mirena IUD placement.    She has a history of chronic GERD symptoms, currently well controlled with once daily omeprazole.   Past Medical History:  Diagnosis Date   Anemia    Anxiety    BV (bacterial vaginosis)    Depression    DM (diabetes mellitus) (HCC)    Gallstones    Hypertension    IBS (irritable bowel syndrome)    Migraine    Obesity, morbid, BMI 50 or higher (HCC)    PFS (patellofemoral syndrome)    right     Past Surgical History:  Procedure Laterality Date   CHOLECYSTECTOMY     HYDRADENITIS EXCISION Right 02/16/2021   Procedure: WIDE EXCISION HIDRADENITIS RIGHT BREAST;  Surgeon: Abigail Miyamoto, MD;  Location: [facility];  Service: General;  Laterality: Right;   Family History  Problem Relation Age of Onset   Diabetes Mother    Hypertension Mother    Congestive Heart Failure Mother    Ovarian cancer Mother    Kidney disease  Mother    Diabetes Father    Hypertension Father    Kidney failure Father    Hypertension Sister    Diabetes Sister    Colon cancer Neg Hx    Esophageal cancer Neg Hx    Pancreatic cancer Neg Hx    Liver disease Neg Hx    Social History   Tobacco Use   Smoking status: Never   Smokeless tobacco: Never  Vaping Use   Vaping status: Some Days   Last attempt to quit: 07/18/2020   Substances: Nicotine   Devices: Hooka  Substance Use Topics   Alcohol use: Yes    Alcohol/week: 0.0 standard drinks of alcohol    Comment: seldom   Drug use: No   Current Outpatient Medications  Medication Sig Dispense Refill   amLODipine (NORVASC) 5 MG tablet Take 1 tablet by  mouth daily.     ferrous sulfate 325 (65 FE) MG EC tablet Take 325 mg by mouth 3 (three) times daily with meals.     hydrochlorothiazide (HYDRODIURIL) 25 MG tablet Take 1 tablet by mouth every morning.     omeprazole (PRILOSEC) 40 MG capsule Take 1 capsule by mouth daily.     ondansetron (ZOFRAN) 4 MG tablet Take by mouth.     Semaglutide, 2 MG/DOSE, (OZEMPIC, 2 MG/DOSE,) 8 MG/3ML SOPN Inject into the skin.     doxycycline (MONODOX) 100 MG capsule Take 1 capsule (100 mg total) by mouth 2 (two) times daily. 20 capsule 1   escitalopram (LEXAPRO) 10 MG tablet Take 10 mg by mouth daily.     famotidine (PEPCID) 20 MG tablet Take 20 mg by mouth daily.     levonorgestrel (MIRENA) 20 MCG/DAY IUD 1 each by Intrauterine route once.     losartan (COZAAR) 50 MG tablet Take 50 mg by mouth daily.     metFORMIN (GLUCOPHAGE-XR) 500 MG 24 hr tablet Take 500 mg by mouth in the morning and at bedtime.     norethindrone (MICRONOR) 0.35 MG tablet Take 1 tablet (0.35 mg total) by mouth daily. (Patient not taking: No sig reported) 84 tablet 4   valACYclovir (VALTREX) 1000 MG tablet Take 500 mg by mouth 2 (two) times daily as needed (outbreak).     No current facility-administered medications for this visit.   No Known Allergies   Review of Systems: All systems reviewed and negative except where noted in HPI.    No results found.  Video Visit Objective Findings   Examination conducted with the use of video cameras/computer monitors. Vital signs and other aspects of physical exam are limited due to the nature of this encounter.   CBC    Component Value Date/Time   WBC 7.4 02/07/2021 0831   RBC 3.89 02/07/2021 0831   HGB 10.1 (L) 02/07/2021 0831   HGB 11.2 05/29/2017 1413   HCT 32.9 (L) 02/07/2021 0831   HCT 33.0 (L) 05/29/2017 1413   PLT 414 (H) 02/07/2021 0831   PLT 383 (H) 05/29/2017 1413   MCV 84.6 02/07/2021 0831   MCV 79 05/29/2017 1413   MCH 26.0 02/07/2021 0831   MCHC 30.7 02/07/2021 0831    RDW 14.0 02/07/2021 0831   RDW 14.7 05/29/2017 1413   LYMPHSABS 2.8 03/08/2015 1002   MONOABS 0.6 03/08/2015 1002   EOSABS 0.1 03/08/2015 1002   BASOSABS 0.0 03/08/2015 1002    CMP     Component Value Date/Time   NA 138 02/07/2021 0831   NA 141 05/29/2017 1413  K 3.9 02/07/2021 0831   CL 105 02/07/2021 0831   CO2 22 02/07/2021 0831   GLUCOSE 160 (H) 02/07/2021 0831   BUN 11 02/07/2021 0831   BUN 9 05/29/2017 1413   CREATININE 0.81 02/07/2021 0831   CALCIUM 8.9 02/07/2021 0831   PROT 8.0 05/29/2017 1413   ALBUMIN 4.0 05/29/2017 1413   AST 20 05/29/2017 1413   ALT 17 05/29/2017 1413   ALKPHOS 89 05/29/2017 1413   BILITOT 0.2 05/29/2017 1413   GFRNONAA >60 02/07/2021 0831   GFRAA 109 05/29/2017 1413       Latest Ref Rng & Units 02/07/2021    8:31 AM 10/02/2020    9:48 PM 05/29/2017    2:13 PM  CBC EXTENDED  WBC 4.0 - 10.5 K/uL 7.4  8.3  6.2   RBC 3.87 - 5.11 MIL/uL 3.89  3.90  4.16   Hemoglobin 12.0 - 15.0 g/dL 95.6  21.3  08.6   HCT 36.0 - 46.0 % 32.9  32.9  33.0   Platelets 150 - 400 K/uL 414  361  383       ASSESSMENT AND PLAN:  42 year old female with medical history as outlined above with 10+ years of bothersome abdominal pain and diarrhea with fecal urgency.  She is status post-cholecystectomy, but believes her symptoms started before her gallbladder was removed.  She did recall benefiting from [facility] when it was prescribed in 2022.    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  I recommended we resume cholestyramine 4 grams twice daily.  Will also add bentyl 20 mg q6hrs PRN.  We discussed the principles of a low FODMAP diet and she will look further into this. We discussed the role of a colonoscopy to evaluate chronic diarrhea, primarily to exclude microscopic colitis and less likely, IBD.  I don't think a colonoscopy is necessary at this time, but if her symptoms do not respond to medical therapy, then a  colonoscopy to exclude [facility] is reasonable.   Will check fecal lactoferrin to exclude inflammatory diarrhea, fecal elastase and alpha gal panel.  TTG/IgA previously checked and was normal. She has long standing anemia, presumably secondary to menstrual blood loss, but she has not been having menses for 2 years since undergoing Mirena placement.  Will recheck CBC and iron panel. At the end of the visit, the patient requested FMLA paperwork due to her symptoms.  I told her she would need to come in to the office for a follow up to discuss that more in depth.   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 Tomasa Rand, MD [facility]  I spent a total of 45 minutes reviewing the patient's medical record, interviewing and examining the patient, discussing her diagnosis and management of her condition going forward, and documenting in the medical record.    Macy Mis, MD

## 2023-05-03 ENCOUNTER — Other Ambulatory Visit (INDEPENDENT_AMBULATORY_CARE_PROVIDER_SITE_OTHER): Payer: Medicaid Other

## 2023-05-03 ENCOUNTER — Ambulatory Visit (INDEPENDENT_AMBULATORY_CARE_PROVIDER_SITE_OTHER): Payer: Medicaid Other | Admitting: Gastroenterology

## 2023-05-03 ENCOUNTER — Encounter: Payer: Self-pay | Admitting: Gastroenterology

## 2023-05-03 ENCOUNTER — Other Ambulatory Visit: Payer: Self-pay

## 2023-05-03 VITALS — BP 140/80 | HR 68 | Ht 65.0 in | Wt 333.0 lb

## 2023-05-03 DIAGNOSIS — K580 Irritable bowel syndrome with diarrhea: Secondary | ICD-10-CM

## 2023-05-03 LAB — CBC WITH DIFFERENTIAL/PLATELET
Basophils Absolute: 0.1 10*3/uL (ref 0.0–0.1)
Basophils Relative: 0.8 % (ref 0.0–3.0)
Eosinophils Absolute: 0.1 10*3/uL (ref 0.0–0.7)
Eosinophils Relative: 1.3 % (ref 0.0–5.0)
HCT: 34.1 % — ABNORMAL LOW (ref 36.0–46.0)
Hemoglobin: 11.1 g/dL — ABNORMAL LOW (ref 12.0–15.0)
Lymphocytes Relative: 41.4 % (ref 12.0–46.0)
Lymphs Abs: 3.1 10*3/uL (ref 0.7–4.0)
MCHC: 32.4 g/dL (ref 30.0–36.0)
MCV: 83.8 fL (ref 78.0–100.0)
Monocytes Absolute: 0.7 10*3/uL (ref 0.1–1.0)
Monocytes Relative: 10 % (ref 3.0–12.0)
Neutro Abs: 3.5 10*3/uL (ref 1.4–7.7)
Neutrophils Relative %: 46.5 % (ref 43.0–77.0)
Platelets: 453 10*3/uL — ABNORMAL HIGH (ref 150.0–400.0)
RBC: 4.07 Mil/uL (ref 3.87–5.11)
RDW: 15.3 % (ref 11.5–15.5)
WBC: 7.5 10*3/uL (ref 4.0–10.5)

## 2023-05-03 LAB — IBC + FERRITIN
Ferritin: 85.9 ng/mL (ref 10.0–291.0)
Iron: 50 ug/dL (ref 42–145)
Saturation Ratios: 15.6 % — ABNORMAL LOW (ref 20.0–50.0)
TIBC: 320.6 ug/dL (ref 250.0–450.0)
Transferrin: 229 mg/dL (ref 212.0–360.0)

## 2023-05-03 MED ORDER — AMITRIPTYLINE HCL 10 MG PO TABS: 10.0000 mg | ORAL_TABLET | Freq: Every day | ORAL | 1 refills | Status: DC

## 2023-05-03 NOTE — Patient Instructions (Signed)
We have sent the following medications to your pharmacy for you to pick up at your convenience: Elavil 10 mg at bedtime one tablet for two weeks then increase to two tablets.  Your provider has requested that you go to the basement level for lab work before leaving today. Press "B" on the elevator. The lab is located at the first door on the left as you exit the elevator.  Follow the low Fodmap diet.  Start metamucil daily.  _______________________________________________________  If your blood pressure at your visit was 140/90 or greater, please contact your primary care physician to follow up on this.  _______________________________________________________  If you are age 43 or older, your body mass index should be between 23-30. Your Body mass index is 55.41 kg/m. If this is out of the aforementioned range listed, please consider follow up with your Primary Care Provider.  If you are age 65 or younger, your body mass index should be between 19-25. Your Body mass index is 55.41 kg/m. If this is out of the aformentioned range listed, please consider follow up with your Primary Care Provider.   ________________________________________________________  The [facility] providers would like to encourage you to use [facility] to communicate with providers for non-urgent requests or questions.  Due to long hold times on the telephone, sending your provider a message by [facility] may be a faster and more efficient way to get a response.  Please allow 48 business hours for a response.  Please remember that this is for non-urgent requests.   It was a pleasure to see you today!  Thank you for trusting me with your gastrointestinal care!    Scott E.Tomasa Rand, MD

## 2023-05-03 NOTE — Progress Notes (Unsigned)
HPI : Heidi Berry is a 42 y.o. female who I initially met via a virtual encounter in August of this year.  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  She does have a history of cholecystectomy and so she was prescribed cholestyramine at that visit.  She was also prescribed Bentyl to help with abdominal pain.  Fecal elastase, lactoferrin and alpha gal test were ordered to exclude other potential MAS[employer] graders of IBS-D, but she did not complete these tests.  We also discussed low FODMAP diet, and she was recommended to try this to help with her symptoms.   Symptoms present over a decade ago.           Past Medical History:  Diagnosis Date   Anemia    Anxiety    BV (bacterial vaginosis)    Depression    DM (diabetes mellitus) (HCC)    Gallstones    Hypertension    IBS (irritable bowel syndrome)    Migraine    Obesity, morbid, BMI 50 or higher (HCC)    PFS (patellofemoral syndrome)    right     Past Surgical History:  Procedure Laterality Date   CHOLECYSTECTOMY     HYDRADENITIS EXCISION Right 02/16/2021   Procedure: WIDE EXCISION HIDRADENITIS RIGHT BREAST;  Surgeon: Abigail Miyamoto, MD;  Location: [facility];  Service: General;  Laterality: Right;   Family History  Problem Relation Age of Onset   Diabetes Mother    Hypertension Mother    Congestive Heart Failure Mother    Ovarian cancer Mother    Kidney disease Mother    Diabetes Father    Hypertension Father    Kidney failure Father    Hypertension Sister    Diabetes Sister    Colon cancer Neg Hx    Esophageal cancer Neg Hx    Pancreatic cancer Neg Hx    Liver disease Neg Hx    Social History   Tobacco Use   Smoking status: Never   Smokeless tobacco: Never  Vaping Use   Vaping status: Some Days   Last attempt to quit: 07/18/2020   Substances: Nicotine   Devices: Hooka  Substance Use Topics   Alcohol use: Yes    Alcohol/week: 0.0 standard  drinks of alcohol    Comment: seldom   Drug use: No   Current Outpatient Medications  Medication Sig Dispense Refill   amLODipine (NORVASC) 5 MG tablet Take 1 tablet by mouth daily.     cholestyramine (QUESTRAN) 4 g packet Take 1 packet (4 g total) by mouth 2 (two) times daily. 120 each 3   dicyclomine (BENTYL) 20 MG tablet Take 1 tablet (20 mg total) by mouth every 6 (six) hours. 60 tablet 3   doxycycline (MONODOX) 100 MG capsule Take 1 capsule (100 mg total) by mouth 2 (two) times daily. 20 capsule 1   escitalopram (LEXAPRO) 10 MG tablet Take 10 mg by mouth daily.     famotidine (PEPCID) 20 MG tablet Take 20 mg by mouth daily.     ferrous sulfate 325 (65 FE) MG EC tablet Take 325 mg by mouth 3 (three) times daily with meals.     hydrochlorothiazide (HYDRODIURIL) 25 MG tablet Take 1 tablet by mouth every morning.     levonorgestrel (MIRENA) 20 MCG/DAY IUD 1 each by Intrauterine route once.     losartan (COZAAR) 50 MG tablet Take 50 mg by mouth daily.     metFORMIN (  GLUCOPHAGE-XR) 500 MG 24 hr tablet Take 500 mg by mouth in the morning and at bedtime.     norethindrone (MICRONOR) 0.35 MG tablet Take 1 tablet (0.35 mg total) by mouth daily. (Patient not taking: No sig reported) 84 tablet 4   omeprazole (PRILOSEC) 40 MG capsule Take 1 capsule by mouth daily.     ondansetron (ZOFRAN) 4 MG tablet Take by mouth.     Semaglutide, 2 MG/DOSE, (OZEMPIC, 2 MG/DOSE,) 8 MG/3ML SOPN Inject into the skin.     valACYclovir (VALTREX) 1000 MG tablet Take 500 mg by mouth 2 (two) times daily as needed (outbreak).     No current facility-administered medications for this visit.   No Known Allergies   Review of Systems: All systems reviewed and negative except where noted in HPI.    No results found.  Physical Exam: There were no vitals taken for this visit. Constitutional: Pleasant,well-developed, ***female in no acute distress. HEENT: Normocephalic and atraumatic. Conjunctivae are normal. No  scleral icterus. Neck supple.  Cardiovascular: Normal rate, regular rhythm.  Pulmonary/chest: Effort normal and breath sounds normal. No wheezing, rales or rhonchi. Abdominal: Soft, nondistended, nontender. Bowel sounds active throughout. There are no masses palpable. No hepatomegaly. Extremities: no edema Lymphadenopathy: No cervical adenopathy noted. Neurological: Alert and oriented to person place and time. Skin: Skin is warm and dry. No rashes noted. Psychiatric: Normal mood and affect. Behavior is normal.  CBC    Component Value Date/Time   WBC 7.4 02/07/2021 0831   RBC 3.89 02/07/2021 0831   HGB 10.1 (L) 02/07/2021 0831   HGB 11.2 05/29/2017 1413   HCT 32.9 (L) 02/07/2021 0831   HCT 33.0 (L) 05/29/2017 1413   PLT 414 (H) 02/07/2021 0831   PLT 383 (H) 05/29/2017 1413   MCV 84.6 02/07/2021 0831   MCV 79 05/29/2017 1413   MCH 26.0 02/07/2021 0831   MCHC 30.7 02/07/2021 0831   RDW 14.0 02/07/2021 0831   RDW 14.7 05/29/2017 1413   LYMPHSABS 2.8 03/08/2015 1002   MONOABS 0.6 03/08/2015 1002   EOSABS 0.1 03/08/2015 1002   BASOSABS 0.0 03/08/2015 1002    CMP     Component Value Date/Time   NA 138 02/07/2021 0831   NA 141 05/29/2017 1413   K 3.9 02/07/2021 0831   CL 105 02/07/2021 0831   CO2 22 02/07/2021 0831   GLUCOSE 160 (H) 02/07/2021 0831   BUN 11 02/07/2021 0831   BUN 9 05/29/2017 1413   CREATININE 0.81 02/07/2021 0831   CALCIUM 8.9 02/07/2021 0831   PROT 8.0 05/29/2017 1413   ALBUMIN 4.0 05/29/2017 1413   AST 20 05/29/2017 1413   ALT 17 05/29/2017 1413   ALKPHOS 89 05/29/2017 1413   BILITOT 0.2 05/29/2017 1413   GFRNONAA >60 02/07/2021 0831   GFRAA 109 05/29/2017 1413       Latest Ref Rng & Units 02/07/2021    8:31 AM 10/02/2020    9:48 PM 05/29/2017    2:13 PM  CBC EXTENDED  WBC 4.0 - 10.5 K/uL 7.4  8.3  6.2   RBC 3.87 - 5.11 MIL/uL 3.89  3.90  4.16   Hemoglobin 12.0 - 15.0 g/dL 57.8  46.9  62.9   HCT 36.0 - 46.0 % 32.9  32.9  33.0   Platelets 150  - 400 K/uL 414  361  383       ASSESSMENT AND PLAN:  Macy Mis, MD

## 2023-05-05 LAB — H. PYLORI ANTIGEN, STOOL: H pylori Ag, Stl: POSITIVE — AB

## 2023-05-06 ENCOUNTER — Other Ambulatory Visit: Payer: Self-pay

## 2023-05-06 DIAGNOSIS — A048 Other specified bacterial intestinal infections: Secondary | ICD-10-CM

## 2023-05-06 LAB — FECAL LACTOFERRIN, QUANT
Fecal Lactoferrin: NEGATIVE
MICRO NUMBER:: 15768705
SPECIMEN QUALITY:: ADEQUATE

## 2023-05-06 MED ORDER — DOXYCYCLINE HYCLATE 100 MG PO CAPS: 100.0000 mg | ORAL_CAPSULE | Freq: Two times a day (BID) | ORAL | 0 refills | Status: DC

## 2023-05-06 MED ORDER — BISMUTH 262 MG PO CHEW: 524.0000 mg | CHEWABLE_TABLET | Freq: Four times a day (QID) | ORAL | 0 refills | Status: DC

## 2023-05-06 MED ORDER — METRONIDAZOLE 250 MG PO TABS: 250.0000 mg | ORAL_TABLET | Freq: Four times a day (QID) | ORAL | 0 refills | Status: DC

## 2023-05-06 MED ORDER — OMEPRAZOLE 40 MG PO CPDR: 40.0000 mg | DELAYED_RELEASE_CAPSULE | Freq: Every day | ORAL | 0 refills | Status: DC

## 2023-05-06 NOTE — Progress Notes (Signed)
Heidi Berry,  Your stool test was positive for the presence of H. pylori, a common bacteria that can live in the stomach.  This bacteria can cause chronic GI symptoms such as abdominal pain, nausea/vomiting, bloating.  Additionally, it can increase the risk for ulcers in the stomach as well as stomach cancer.  It can also contribute to iron malabsorption and iron deficiency.  When found, it is recommended that the bacteria be eradicated. The bacteria can sometimes be difficult to eradicate due to bacterial resistance, so it is very important that you take the medication regiment exactly as prescribed.  1) Omeprazole 40 mg 2 times a day x 14 d 2) Pepto Bismol 2 tabs (262 mg each) 4 times a day x 14 d 3) Metronidazole 250 mg 4 times a day x 14 d 4) doxycycline 100 mg 2 times a day x 14 d  After 14 days, ok to stop omeprazole.  4 weeks after treatment completed, check H. Pylori stool antigen to confirm eradication (must be off acid suppression therapy for 2 weeks prior to specimen submission)  Heidi Berry, Can you please prescribe the medications above and place order for stool antigen test in 6 weeks?

## 2023-05-08 LAB — ALPHA-GAL PANEL
Allergen, Mutton, f88: <0.1 kU/L
Allergen, Pork, f26: <0.1 kU/L
Beef: <0.1 kU/L
CLASS: 0
CLASS: 0
Class: 0
GALACTOSE-ALPHA-1,3-GALACTOSE IGE*: <0.1 kU/L (ref <0.10)

## 2023-05-08 LAB — INTERPRETATION:

## 2023-05-09 LAB — PANCREATIC ELASTASE, FECAL: Pancreatic Elastase-1, Stool: >800 ug/g (ref >200)

## 2023-05-13 ENCOUNTER — Telehealth: Payer: Self-pay

## 2023-05-13 NOTE — Telephone Encounter (Signed)
I reached Heidi Berry by phone and told her we faxed her FMLA paperwork 05/06/2023. She said she failed to put her employee # on it and she gave it to me # [phone] and I re-faxed it today. I am mailing her a copy of the paperwork, confirmed her address. She said Thank you. It is scanned into epic minus the employee #.

## 2023-05-14 NOTE — Progress Notes (Signed)
Ms. Downing,  Your tests for pancreatic exocrine insufficiency, intestinal inflammation and alpha gal panel were all normal.  Hopefully, some of your symptoms will improve with eradication of the H. Pylori.

## 2023-06-04 ENCOUNTER — Other Ambulatory Visit: Payer: Self-pay | Admitting: Gastroenterology

## 2023-07-02 ENCOUNTER — Other Ambulatory Visit: Payer: Self-pay | Admitting: Gastroenterology

## 2023-07-03 ENCOUNTER — Other Ambulatory Visit: Payer: Medicaid Other

## 2023-07-03 DIAGNOSIS — A048 Other specified bacterial intestinal infections: Secondary | ICD-10-CM

## 2023-07-05 ENCOUNTER — Encounter: Payer: Self-pay | Admitting: Gastroenterology

## 2023-07-05 LAB — H. PYLORI ANTIGEN, STOOL: H pylori Ag, Stl: NEGATIVE

## 2023-07-05 NOTE — Progress Notes (Signed)
Heidi Berry,  Your H. Pylori test is negative, indicating that the bacteria has been successfully eradicated.  Have you had some improvement in your GI symptoms?

## 2023-07-28 ENCOUNTER — Other Ambulatory Visit: Payer: Self-pay | Admitting: Gastroenterology

## 2023-08-02 ENCOUNTER — Encounter: Payer: Self-pay | Admitting: Gastroenterology

## 2023-08-02 ENCOUNTER — Ambulatory Visit (INDEPENDENT_AMBULATORY_CARE_PROVIDER_SITE_OTHER): Payer: Medicaid Other | Admitting: Gastroenterology

## 2023-08-02 VITALS — BP 138/92 | HR 96 | Ht 65.0 in | Wt 334.1 lb

## 2023-08-02 DIAGNOSIS — K915 Postcholecystectomy syndrome: Secondary | ICD-10-CM | POA: Diagnosis not present

## 2023-08-02 DIAGNOSIS — K580 Irritable bowel syndrome with diarrhea: Secondary | ICD-10-CM | POA: Diagnosis not present

## 2023-08-02 DIAGNOSIS — Z8619 Personal history of other infectious and parasitic diseases: Secondary | ICD-10-CM | POA: Insufficient documentation

## 2023-08-02 MED ORDER — DICYCLOMINE HCL 20 MG PO TABS: 20.0000 mg | ORAL_TABLET | Freq: Four times a day (QID) | ORAL | 3 refills | Status: AC

## 2023-08-02 MED ORDER — AMITRIPTYLINE HCL 10 MG PO TABS: 20.0000 mg | ORAL_TABLET | Freq: Every day | ORAL | 1 refills | Status: DC

## 2023-08-02 NOTE — Patient Instructions (Addendum)
We have sent the following medications to your pharmacy for you to pick up at your convenience: Bentyl 20 mg daily at bed time.  Take Cholestyramine in the morning daily.  _______________________________________________________  If your blood pressure at your visit was 140/90 or greater, please contact your primary care physician to follow up on this.  _______________________________________________________  If you are age 44 or older, your body mass index should be between 23-30. Your Body mass index is 55.6 kg/m. If this is out of the aforementioned range listed, please consider follow up with your Primary Care Provider.  If you are age 40 or younger, your body mass index should be between 19-25. Your Body mass index is 55.6 kg/m. If this is out of the aformentioned range listed, please consider follow up with your Primary Care Provider.   ________________________________________________________  The [facility] providers would like to encourage you to use [facility] to communicate with providers for non-urgent requests or questions.  Due to long hold times on the telephone, sending your provider a message by [facility] may be a faster and more efficient way to get a response.  Please allow 48 business hours for a response.  Please remember that this is for non-urgent requests.  _______________________________________________________    It was a pleasure to see you today!  Thank you for trusting me with your gastrointestinal care!    Scott E.Tomasa Rand, MD

## 2023-08-02 NOTE — Progress Notes (Signed)
HPI : Heidi Berry is a 42 y.o. female with a history of anxiety, depression, diabetes, migraines and morbid obesity who presents for follow-up of diarrhea predominant IBS.  I initially saw patient in August through a virtual visit.  She was prescribed Bentyl to help with her abdominal pain, and advised to take cholestyramine to help with her diarrhea, given her history of cholecystectomy. We had also discussed a low FODMAP diet.  Fecal elastase, lactoferrin and alpha gal were ordered.  She was seen in follow-up in November, with no change in symptoms, but admitted to not taking the medications as prescribed.  At that visit, she was started on Elavil and advised to continue cholestyramine and Bentyl as needed.  She was also recommended to take Metamucil on a daily basis. At that visit, an H. pylori stool test was ordered and was positive.  She was treated with quadruple therapy, with a negative stool test last month.  Fecal elastase and lactoferrin were both normal.  Alpha gal panel was normal.  Today, she experiences persistent diarrhea but has also had recent episodes of constipation lasting about two days. Currently, her stools are more formed but still mushy, and she experiences urgency, requiring her to run to the bathroom. She feels weak and exhausted after bowel movements, with associated abdominal cramping and rectal pain due to frequent wiping and irritation.  She typically has at least 3 bowel movements per day, usually with urgency  She experiences severe intestinal cramping during bowel movements, described as 'like a charley horse' in her abdomen, primarily on the left side, lasting for several minutes. These episodes are infrequent but very painful.  She was previously diagnosed with H. pylori infection, which was treated successfully, as confirmed by a negative stool test. However, her symptoms of IBS, including nausea, have not significantly improved post-treatment, but she notes her  nausea is less frequent now.  She is currently taking Elavil at bedtime for IBS, but still at one tablet instead of the recommended two. She also uses cholestyramine powder, a bile acid binder, but inconsistently, taking it about three to four times a week due to forgetfulness. It does make a difference when taken.  She works from home and finds it challenging to manage her symptoms while working, especially due to the discomfort from rectal irritation and the need to sit for long periods.      Past Medical History:  Diagnosis Date   Anemia    Anxiety    BV (bacterial vaginosis)    Depression    DM (diabetes mellitus) (HCC)    Gallstones    Hypertension    IBS (irritable bowel syndrome)    Migraine    Obesity, morbid, BMI 50 or higher (HCC)    PFS (patellofemoral syndrome)    right     Past Surgical History:  Procedure Laterality Date   CHOLECYSTECTOMY     HYDRADENITIS EXCISION Right 02/16/2021   Procedure: WIDE EXCISION HIDRADENITIS RIGHT BREAST;  Surgeon: Abigail Miyamoto, MD;  Location: [facility];  Service: General;  Laterality: Right;   Family History  Problem Relation Age of Onset   Diabetes Mother    Hypertension Mother    Congestive Heart Failure Mother    Ovarian cancer Mother    Kidney disease Mother    Diabetes Father    Hypertension Father    Kidney failure Father    Hypertension Sister    Diabetes Sister    Colon cancer Neg Hx    Esophageal  cancer Neg Hx    Pancreatic cancer Neg Hx    Liver disease Neg Hx    Social History   Tobacco Use   Smoking status: Some Days    Types: Cigars   Smokeless tobacco: Never  Vaping Use   Vaping status: Some Days   Last attempt to quit: 07/18/2020   Substances: Nicotine   Devices: Hooka  Substance Use Topics   Alcohol use: Yes    Alcohol/week: 0.0 standard drinks of alcohol    Comment: seldom   Drug use: No   Current Outpatient Medications  Medication Sig Dispense Refill   amLODipine (NORVASC) 5 MG tablet Take 1  tablet by mouth daily.     cholestyramine (QUESTRAN) 4 g packet Take 1 packet (4 g total) by mouth 2 (two) times daily. 120 each 3   escitalopram (LEXAPRO) 10 MG tablet Take 10 mg by mouth daily.     ferrous sulfate 325 (65 FE) MG EC tablet Take 325 mg by mouth 3 (three) times daily with meals.     hydrochlorothiazide (HYDRODIURIL) 25 MG tablet Take 1 tablet by mouth every morning.     levonorgestrel (MIRENA) 20 MCG/DAY IUD 1 each by Intrauterine route once.     losartan (COZAAR) 50 MG tablet Take 50 mg by mouth daily.     metFORMIN (GLUCOPHAGE-XR) 500 MG 24 hr tablet Take 500 mg by mouth in the morning and at bedtime.     Semaglutide, 2 MG/DOSE, (OZEMPIC, 2 MG/DOSE,) 8 MG/3ML SOPN Inject into the skin.     valACYclovir (VALTREX) 1000 MG tablet Take 500 mg by mouth 2 (two) times daily as needed (outbreak).     amitriptyline (ELAVIL) 10 MG tablet Take 2 tablets (20 mg total) by mouth at bedtime. 180 tablet 1   dicyclomine (BENTYL) 20 MG tablet Take 1 tablet (20 mg total) by mouth every 6 (six) hours. 60 tablet 3   doxycycline (MONODOX) 100 MG capsule Take 1 capsule (100 mg total) by mouth 2 (two) times daily. 20 capsule 1   famotidine (PEPCID) 20 MG tablet Take 20 mg by mouth daily.     norethindrone (MICRONOR) 0.35 MG tablet Take 1 tablet (0.35 mg total) by mouth daily. (Patient not taking: Reported on 08/02/2023) 84 tablet 4   ondansetron (ZOFRAN) 4 MG tablet Take by mouth.     No current facility-administered medications for this visit.   No Known Allergies   Review of Systems: All systems reviewed and negative except where noted in HPI.    No results found.  Physical Exam: BP (!) 138/92   Pulse 96   Ht 5\' 5"  (1.651 m)   Wt (!) 334 lb 2 oz (151.6 kg)   BMI 55.60 kg/m  Constitutional: Pleasant,well-developed, obese African American female in no acute distress. Neurological: Alert and oriented to person place and time. Skin: Skin is warm and dry. No rashes noted. Psychiatric:  Normal mood and affect. Behavior is normal.  CBC    Component Value Date/Time   WBC 7.5 05/03/2023 1507   RBC 4.07 05/03/2023 1507   HGB 11.1 (L) 05/03/2023 1507   HGB 11.2 05/29/2017 1413   HCT 34.1 (L) 05/03/2023 1507   HCT 33.0 (L) 05/29/2017 1413   PLT 453.0 (H) 05/03/2023 1507   PLT 383 (H) 05/29/2017 1413   MCV 83.8 05/03/2023 1507   MCV 79 05/29/2017 1413   MCH 26.0 02/07/2021 0831   MCHC 32.4 05/03/2023 1507   RDW 15.3 05/03/2023 1507  RDW 14.7 05/29/2017 1413   LYMPHSABS 3.1 05/03/2023 1507   MONOABS 0.7 05/03/2023 1507   EOSABS 0.1 05/03/2023 1507   BASOSABS 0.1 05/03/2023 1507    CMP     Component Value Date/Time   NA 138 02/07/2021 0831   NA 141 05/29/2017 1413   K 3.9 02/07/2021 0831   CL 105 02/07/2021 0831   CO2 22 02/07/2021 0831   GLUCOSE 160 (H) 02/07/2021 0831   BUN 11 02/07/2021 0831   BUN 9 05/29/2017 1413   CREATININE 0.81 02/07/2021 0831   CALCIUM 8.9 02/07/2021 0831   PROT 8.0 05/29/2017 1413   ALBUMIN 4.0 05/29/2017 1413   AST 20 05/29/2017 1413   ALT 17 05/29/2017 1413   ALKPHOS 89 05/29/2017 1413   BILITOT 0.2 05/29/2017 1413   GFRNONAA >60 02/07/2021 0831   GFRAA 109 05/29/2017 1413       Latest Ref Rng & Units 05/03/2023    3:07 PM 02/07/2021    8:31 AM 10/02/2020    9:48 PM  CBC EXTENDED  WBC 4.0 - 10.5 K/uL 7.5  7.4  8.3   RBC 3.87 - 5.11 Mil/uL 4.07  3.89  3.90   Hemoglobin 12.0 - 15.0 g/dL 21.3  08.6  57.8   HCT 36.0 - 46.0 % 34.1  32.9  32.9   Platelets 150.0 - 400.0 K/uL 453.0  414  361   NEUT# 1.4 - 7.7 K/uL 3.5     Lymph# 0.7 - 4.0 K/uL 3.1         ASSESSMENT AND PLAN:   Irritable Bowel Syndrome, diarrhea predominant (IBS-D) Symptoms include frequent bowel movements (>3/day) with urgency, abdominal pain, and post-defecation fatigue/weakness. Recent H. pylori infection treated successfully, but symptoms persist. Current medications: Elavil (amitriptyline) and cholestyramine. Occasional severe cramping episodes  resembling intestinal spasms. Discussed increasing Elavil and cholestyramine, including potential side effects like constipation and need for titration. Cholestyramine can be taken up to three times daily if needed. Discussed dicyclomine (Bentyl) for severe cramping as an antispasmodic. 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 Symptoms include frequent bowel movements and rectal irritation. Discussed cholestyramine's role in binding bile acids to reduce symptoms. Increasing cholestyramine to three times daily is an option if symptoms persist. - Take cholestyramine daily to bind bile acids and reduce diarrhea and urgency - Consider increasing cholestyramine to three times daily if needed  H. pylori Infection (Resolved) Previously treated successfully with quadruple therapy.  Addressed and alleviated concerns about cancer risk. Explained that while H. pylori increases the risk of ulcers and cancer, the overall risk remains low, especially post-treatment.. - No further testing or treatment indicated  Danie Hannig E. Tomasa Rand, MD [facility]  I spent a total of 30 minutes reviewing the patient's medical record, interviewing and examining the patient, discussing her diagnosis and management of her condition going forward, and documenting in the medical record     [place]coe, Sharrie Rothman, MD

## 2023-08-30 ENCOUNTER — Telehealth: Payer: Self-pay | Admitting: Gastroenterology

## 2023-08-30 NOTE — Telephone Encounter (Signed)
 Left VM for patient letting her know that the last forms  we received were in office and they were faxed successfully. Also asked patient if there were other forms to please let us know.

## 2023-08-30 NOTE — Telephone Encounter (Signed)
 Inbound call from patient requesting a call to discuss if Dr. Tomasa Rand had updated her [employer] paperwork. Please advise.

## 2023-09-30 ENCOUNTER — Other Ambulatory Visit: Payer: Self-pay | Admitting: Gastroenterology

## 2023-10-17 ENCOUNTER — Telehealth: Payer: Self-pay | Admitting: Gastroenterology

## 2023-10-17 NOTE — Telephone Encounter (Signed)
 Inbound call from patient requesting to discuss FMLA paperwork. Patient is also requesting to know if she is able to change 5/14 appointment to a virtual visit due to lack of transportation. Requesting a call back. Please advise, thank you.

## 2023-10-18 NOTE — Telephone Encounter (Signed)
 Pt wanted to know how to download FMLA paperwork to the portal. Discussed with pt that these have to be brought into the office to be filled out. She is aware and states she will keep her OV as scheduled.

## 2023-10-23 ENCOUNTER — Encounter: Payer: Self-pay | Admitting: Gastroenterology

## 2023-10-23 ENCOUNTER — Ambulatory Visit (INDEPENDENT_AMBULATORY_CARE_PROVIDER_SITE_OTHER): Payer: Medicaid Other | Admitting: Gastroenterology

## 2023-10-23 VITALS — BP 134/86 | HR 111 | Ht 65.0 in | Wt 339.4 lb

## 2023-10-23 DIAGNOSIS — K580 Irritable bowel syndrome with diarrhea: Secondary | ICD-10-CM

## 2023-10-23 MED ORDER — AMITRIPTYLINE HCL 10 MG PO TABS: 30.0000 mg | ORAL_TABLET | Freq: Every day | ORAL | 1 refills | Status: AC

## 2023-10-23 NOTE — Progress Notes (Unsigned)
 HPI :  Heidi Berry is a 43 y.o. female with a history of anxiety, depression, diabetes, migraines and morbid obesity who presents for follow-up of diarrhea predominant IBS.  I initially saw patient in August through a virtual visit.  She was prescribed Bentyl  to help with her abdominal pain, and advised to take cholestyramine  to help with her diarrhea, given her history of cholecystectomy.   She was seen in follow-up in November, with no change in symptoms, but admitted to not taking the medications as prescribed.  At that visit, she was started on Elavil  and advised to continue cholestyramine  and Bentyl  as needed.  She was also recommended to take Metamucil on a daily basis. At that visit, an H. pylori stool test was ordered and was positive.  She was treated with quadruple therapy, with a negative stool test last month.  Fecal elastase and lactoferrin were both normal.  Alpha gal panel was normal.  She was last seen by me in February at which she was having improved, but persistent symptoms of diarrhea with urgency, as well as abdominal pain, fatigue and nausea.  Her Elavil  was increased to 20 mg and she was encouraged to use cholestyramine  every day, increasing to twice a day.     Discussed the use of AI scribe software for clinical note transcription with the patient, who gave verbal consent to proceed.  History of Present Illness   Heidi Berry is a 43 year old female with irritable bowel syndrome who presents for management of gastrointestinal symptoms.  She manages her IBS symptoms by adjusting her cholestyramine  intake, typically taking one packet in the morning and one in the evening. Two packets a day help solidify her stools, but she still experiences urgency and occasional diarrhea. Over the last three days, she has felt unwell with increased diarrhea, gas, and loud bowel sounds.  She is on Elavil  (amitriptyline ) and takes it consistently at night. She is also on Lexapro  (escitalopram) and Ozempic, although she sometimes forgets to take the Ozempic due to its weekly dosing schedule. She is working on dietary changes, including reducing meat intake and increasing vegetables. She consumes dairy daily with her coffee, which may contribute to her symptoms.  She has an IUD and does not experience menstrual periods. She was previously prescribed norethindrone , which she believes is no longer necessary. Her weight fluctuates, and she attributes some of this to forgetting her Ozempic doses. She does not consume artificial sweeteners or sugar-free beverages. She experiences intermittent cramping, which she associates with her IBS.           Past Medical History:  Diagnosis Date   Anemia    Anxiety    BV (bacterial vaginosis)    Depression    DM (diabetes mellitus) (HCC)    Gallstones    Hypertension    IBS (irritable bowel syndrome)    Migraine    Obesity, morbid, BMI 50 or higher (HCC)    PFS (patellofemoral syndrome)    right     Past Surgical History:  Procedure Laterality Date   CHOLECYSTECTOMY     HYDRADENITIS EXCISION Right 02/16/2021   Procedure: WIDE EXCISION HIDRADENITIS RIGHT BREAST;  Surgeon: Oza Blumenthal, MD;  Location: [facility];  Service: General;  Laterality: Right;   Family History  Problem Relation Age of Onset   Diabetes Mother    Hypertension Mother    Congestive Heart Failure Mother    Ovarian cancer Mother    Kidney disease Mother  Diabetes Father    Hypertension Father    Kidney failure Father    Hypertension Sister    Diabetes Sister    Colon cancer Neg Hx    Esophageal cancer Neg Hx    Pancreatic cancer Neg Hx    Liver disease Neg Hx    Social History   Tobacco Use   Smoking status: Some Days    Types: Cigars   Smokeless tobacco: Never  Vaping Use   Vaping status: Some Days   Last attempt to quit: 07/18/2020   Substances: Nicotine   Devices: Hooka  Substance Use Topics   Alcohol use: Yes    Alcohol/week:  0.0 standard drinks of alcohol    Comment: seldom   Drug use: No   Current Outpatient Medications  Medication Sig Dispense Refill   amitriptyline  (ELAVIL ) 10 MG tablet Take 2 tablets (20 mg total) by mouth at bedtime. 180 tablet 1   amLODipine (NORVASC) 5 MG tablet Take 1 tablet by mouth daily.     cholestyramine  (QUESTRAN ) 4 g packet Take 1 packet (4 g total) by mouth 2 (two) times daily. 120 each 3   dicyclomine  (BENTYL ) 20 MG tablet Take 1 tablet (20 mg total) by mouth every 6 (six) hours. 60 tablet 3   escitalopram (LEXAPRO) 10 MG tablet Take 10 mg by mouth daily.     famotidine  (PEPCID ) 20 MG tablet Take 20 mg by mouth daily.     ferrous sulfate 325 (65 FE) MG EC tablet Take 325 mg by mouth 3 (three) times daily with meals.     hydrochlorothiazide (HYDRODIURIL) 25 MG tablet Take 1 tablet by mouth every morning.     levonorgestrel  (MIRENA ) 20 MCG/DAY IUD 1 each by Intrauterine route once.     losartan (COZAAR) 50 MG tablet Take 50 mg by mouth daily.     metFORMIN (GLUCOPHAGE-XR) 500 MG 24 hr tablet Take 500 mg by mouth in the morning and at bedtime.     norethindrone  (MICRONOR ) 0.35 MG tablet Take 1 tablet (0.35 mg total) by mouth daily. (Patient not taking: Reported on 08/02/2023) 84 tablet 4   Semaglutide, 2 MG/DOSE, (OZEMPIC, 2 MG/DOSE,) 8 MG/3ML SOPN Inject into the skin.     valACYclovir (VALTREX) 1000 MG tablet Take 500 mg by mouth 2 (two) times daily as needed (outbreak).     No current facility-administered medications for this visit.   No Known Allergies   Review of Systems: All systems reviewed and negative except where noted in HPI.    No results found.  Physical Exam: Ht 5\' 5"  (1.651 m)   Wt (!) 339 lb 6.4 oz (154 kg)   BMI 56.48 kg/m  Constitutional: Pleasant,well-developed, ***female in no acute distress. HEENT: Normocephalic and atraumatic. Conjunctivae are normal. No scleral icterus. Neck supple.  Cardiovascular: Normal rate, regular rhythm.   Pulmonary/chest: Effort normal and breath sounds normal. No wheezing, rales or rhonchi. Abdominal: Soft, nondistended, nontender. Bowel sounds active throughout. There are no masses palpable. No hepatomegaly. Extremities: no edema Lymphadenopathy: No cervical adenopathy noted. Neurological: Alert and oriented to person place and time. Skin: Skin is warm and dry. No rashes noted. Psychiatric: Normal mood and affect. Behavior is normal.  CBC    Component Value Date/Time   WBC 7.5 05/03/2023 1507   RBC 4.07 05/03/2023 1507   HGB 11.1 (L) 05/03/2023 1507   HGB 11.2 05/29/2017 1413   HCT 34.1 (L) 05/03/2023 1507   HCT 33.0 (L) 05/29/2017 1413   PLT 453.0 (  H) 05/03/2023 1507   PLT 383 (H) 05/29/2017 1413   MCV 83.8 05/03/2023 1507   MCV 79 05/29/2017 1413   MCH 26.0 02/07/2021 0831   MCHC 32.4 05/03/2023 1507   RDW 15.3 05/03/2023 1507   RDW 14.7 05/29/2017 1413   LYMPHSABS 3.1 05/03/2023 1507   MONOABS 0.7 05/03/2023 1507   EOSABS 0.1 05/03/2023 1507   BASOSABS 0.1 05/03/2023 1507    CMP     Component Value Date/Time   NA 138 02/07/2021 0831   NA 141 05/29/2017 1413   K 3.9 02/07/2021 0831   CL 105 02/07/2021 0831   CO2 22 02/07/2021 0831   GLUCOSE 160 (H) 02/07/2021 0831   BUN 11 02/07/2021 0831   BUN 9 05/29/2017 1413   CREATININE 0.81 02/07/2021 0831   CALCIUM 8.9 02/07/2021 0831   PROT 8.0 05/29/2017 1413   ALBUMIN 4.0 05/29/2017 1413   AST 20 05/29/2017 1413   ALT 17 05/29/2017 1413   ALKPHOS 89 05/29/2017 1413   BILITOT 0.2 05/29/2017 1413   GFRNONAA >60 02/07/2021 0831   GFRAA 109 05/29/2017 1413       Latest Ref Rng & Units 05/03/2023    3:07 PM 02/07/2021    8:31 AM 10/02/2020    9:48 PM  CBC EXTENDED  WBC 4.0 - 10.5 K/uL 7.5  7.4  8.3   RBC 3.87 - 5.11 Mil/uL 4.07  3.89  3.90   Hemoglobin 12.0 - 15.0 g/dL 16.1  09.6  04.5   HCT 36.0 - 46.0 % 34.1  32.9  32.9   Platelets 150.0 - 400.0 K/uL 453.0  414  361   NEUT# 1.4 - 7.7 K/uL 3.5     Lymph# 0.7  - 4.0 K/uL 3.1         ASSESSMENT AND PLAN:  Assessment and Plan    Irritable Bowel Syndrome (IBS) IBS with diarrhea, urgency, and gas, fluctuating with diet and medication adherence. Current regimen includes cholestyramine , which aids stool formation but requires adjustment based on dietary intake. Recent increase in gas and diarrhea, possibly related to dairy intake. She is not consistently taking cholestyramine  twice daily as recommended. Discussed using technology, such as phone reminders, to improve medication adherence. - Increase cholestyramine  to twice daily to improve stool consistency and reduce urgency. - Eliminate dairy from diet to reduce diarrhea and gas. - Provide low FODMAP diet list to help manage symptoms.  Depression Depression managed with escitalopram and low-dose amitriptyline , which she tolerates well. Plan to increase amitriptyline  dose to improve symptom control. - Increase amitriptyline  to 30 mg at night. - Rewrite prescription for amitriptyline  to reflect increased dose.        Claudell Cruz, MD

## 2023-10-23 NOTE — Patient Instructions (Signed)
 Increase Elavil  30 mg at bedtime.  Cholestyramine  twice daily.  Dr Cherryl Corona recommends that you complete a bowel purge (to clean out your bowels). Please do the following: Purchase a bottle of Miralax over the counter as well as a box of 5 mg dulcolax tablets. Take 4 dulcolax tablets. Wait 1 hour. You will then drink 6-8 capfuls of Miralax mixed in an adequate amount of water/juice/gatorade (you may choose which of these liquids to drink) over the next 2-3 hours. You should expect results within 1 to 6 hours after completing the bowel purge.  _______________________________________________________  If your blood pressure at your visit was 140/90 or greater, please contact your primary care physician to follow up on this.  _______________________________________________________  If you are age 44 or older, your body mass index should be between 23-30. Your Body mass index is 56.48 kg/m. If this is out of the aforementioned range listed, please consider follow up with your Primary Care Provider.  If you are age 68 or younger, your body mass index should be between 19-25. Your Body mass index is 56.48 kg/m. If this is out of the aformentioned range listed, please consider follow up with your Primary Care Provider.   ________________________________________________________  The [facility] providers would like to encourage you to use MYCHART to communicate with providers for non-urgent requests or questions.  Due to long hold times on the telephone, sending your provider a message by [facility] may be a faster and more efficient way to get a response.  Please allow 48 business hours for a response.  Please remember that this is for non-urgent requests.  _______________________________________________________   It was a pleasure to see you today!  Thank you for trusting me with your gastrointestinal care!    Scott E.Cherryl Corona, MD

## 2024-01-06 ENCOUNTER — Ambulatory Visit: Admitting: Physician Assistant

## 2024-01-06 ENCOUNTER — Encounter: Payer: Self-pay | Admitting: Physician Assistant

## 2024-01-06 ENCOUNTER — Other Ambulatory Visit (HOSPITAL_COMMUNITY)
Admission: RE | Admit: 2024-01-06 | Discharge: 2024-01-06 | Disposition: A | Discharge status: Home / Self Care | Source: Ambulatory Visit | Attending: Physician Assistant | Admitting: Physician Assistant

## 2024-01-06 VITALS — BP 131/83 | HR 83 | Ht 65.0 in | Wt 336.2 lb

## 2024-01-06 DIAGNOSIS — Z0000 Encounter for general adult medical examination without abnormal findings: Secondary | ICD-10-CM | POA: Diagnosis present

## 2024-01-06 DIAGNOSIS — Z01419 Encounter for gynecological examination (general) (routine) without abnormal findings: Secondary | ICD-10-CM

## 2024-01-06 DIAGNOSIS — Z8041 Family history of malignant neoplasm of ovary: Secondary | ICD-10-CM | POA: Diagnosis not present

## 2024-01-06 DIAGNOSIS — Z1331 Encounter for screening for depression: Secondary | ICD-10-CM

## 2024-01-06 DIAGNOSIS — Z124 Encounter for screening for malignant neoplasm of cervix: Secondary | ICD-10-CM

## 2024-01-06 NOTE — Patient Instructions (Signed)
 If you need urgent mental health care, go to:  [facility] in [place], [place]   Address: [address] [place], [place] [address] Phone: [phone] Hours: Open 24 hours without need for appointment

## 2024-01-06 NOTE — Progress Notes (Signed)
 ANNUAL EXAM Patient name: Heidi Berry MRN [id number]  Date of birth: [birth year] Chief Complaint:   No chief complaint on file.  History of Present Illness:   Heidi Berry is a 43 y.o. G53P1001 female being seen today for a routine annual exam.   Current complaints: None  No LMP recorded. (Menstrual status: IUD).  The pregnancy intention screening data noted above was reviewed. Potential methods of contraception were discussed. The patient elected to proceed with No data recorded.   Last pap 07/07/20. Results were: NILM w/ HRHPV negative, though patient would like this repeated today. H/O abnormal pap: no Last mammogram: Mammogram earlier this year at [facility]. Results were: normal. Family h/o breast cancer: no. Mother with ovarian cancer.  Last colonoscopy: Never previously done due to age. Family h/o colorectal cancer: no STI screening: Accepts Contraception: Mirena  IUD inserted 01/25/21     01/06/2024    3:34 PM  Depression screen PHQ 2/9  Decreased Interest 1  Down, Depressed, Hopeless 1  PHQ - 2 Score 2  Altered sleeping 3  Tired, decreased energy 2  Change in appetite 0  Feeling bad or failure about yourself  2  Trouble concentrating 1  Moving slowly or fidgety/restless 0  Suicidal thoughts 0  PHQ-9 Score 10        01/06/2024    3:36 PM  GAD 7 : Generalized Anxiety Score  Nervous, Anxious, on Edge 2  Control/stop worrying 3  Worry too much - different things 3  Trouble relaxing 1  Restless 0  Easily annoyed or irritable 0  Afraid - awful might happen 3  Total GAD 7 Score 12     Review of Systems:   Pertinent items are noted in HPI Denies any headaches, blurred vision, fatigue, shortness of breath, chest pain, abdominal pain, abnormal vaginal discharge/itching/odor/irritation, problems with periods, bowel movements, urination, or intercourse unless otherwise stated above. Pertinent History Reviewed:  Reviewed past medical,surgical, social and family  history.  Reviewed problem list, medications and allergies. Physical Assessment:   Vitals:   01/06/24 1519  BP: 131/83  Pulse: 83  Weight: (!) 336 lb 3.2 oz (152.5 kg)  Height: 5' 5 (1.651 m)  Body mass index is 55.95 kg/m.        Physical Examination:   General appearance - Well appearing, and in no distress  Mental status - alert, oriented to person, place, and time  Psych:  She has a normal mood and affect  Skin - warm and dry, normal color, no suspicious lesions noted  Chest - effort normal, all lung fields clear to auscultation bilaterally  Heart - normal rate and regular rhythm  Neck:  +acanthosis nigricans. Midline trachea, no thyromegaly or nodules  Breasts - breasts appear normal, no suspicious masses, no skin or nipple changes or axillary nodes  Abdomen - soft, nontender, nondistended, no masses or organomegaly  Pelvic - VULVA: normal appearing vulva with no masses, tenderness or lesions  VAGINA: normal appearing vagina with normal color and discharge, no lesions  CERVIX: +IUD strings visualized. Normal appearing cervix without discharge or lesions, no CMT  Thin prep pap is done with HR HPV cotesting  UTERUS: uterus is felt to be normal size, shape, consistency and nontender   ADNEXA: No adnexal masses or tenderness noted.  Extremities:  No swelling or varicosities noted  Chaperone present for exam  No results found for this or any previous visit (from the past 24 hours).  Assessment & Plan:  1. Encounter for annual  routine gynecological examination (Primary) 2. Cervical cancer screening - Cervical cancer screening: Discussed guidelines. Pap with HPV updated to day at patient's request.  - STD Testing: accepts - Birth Control: Mirena  IUD in place - Breast Health: Encouraged self breast awareness/SBE. Teaching provided. Discussed limits of clinical breast exam for detecting breast cancer. MXR up to date - F/U 12 months and prn  - Cytology - PAP( [place]) -  Cervicovaginal ancillary only( [place]) - Hepatitis B Surface AntiGEN - Hepatitis C Antibody - HIV antibody (with reflex) - RPR  3. Family history of ovarian cancer First degree relative with ovarian cancer. Discussed availability of Empower testing. Referral to genetic counseling placed to discuss implications of genetic testing if patient would like. Otherwise, patient may schedule lab-only visit at her earliest convenience.   4. Positive depression screening On lexapro 10 mg, managed by PCP. Patient without SI/HI, thoughts of self-harm. She is open to increased dose of SSRI and speaking to [facility] counselors. [facility] contact discussed and shared in AVS. Encouraged f/u with PCP.  - Amb ref to [facility]   Orders Placed This Encounter  Procedures   Hepatitis B Surface AntiGEN   Hepatitis C Antibody   HIV antibody (with reflex)   RPR   Amb ref to [facility]    Meds: No orders of the defined types were placed in this encounter.   Follow-up: No follow-ups on file.  Viann Nielson E Rayen Dafoe, [place] 01/06/2024 3:53 PM

## 2024-01-06 NOTE — Progress Notes (Signed)
 Pt states she had a mammogram at [facility] this year.   Mother had ovarian cancer.   PHQ is 10 and GAD 12. Pt not currently seeing counselor, though states she has history of major depressive disorder and generalized anxiety. Pt made aware of referral.  Pt states she has menopausal questions for the provider.

## 2024-01-07 LAB — CERVICOVAGINAL ANCILLARY ONLY
Bacterial Vaginitis (gardnerella): NEGATIVE
Candida Glabrata: POSITIVE — AB
Candida Vaginitis: NEGATIVE
Chlamydia: NEGATIVE
Comment: NEGATIVE
Comment: NEGATIVE
Comment: NEGATIVE
Comment: NEGATIVE
Comment: NEGATIVE
Comment: NORMAL
Neisseria Gonorrhea: NEGATIVE
Trichomonas: NEGATIVE

## 2024-01-07 LAB — HEPATITIS C ANTIBODY: Hep C Virus Ab: NONREACTIVE

## 2024-01-07 LAB — HIV ANTIBODY (ROUTINE TESTING W REFLEX): HIV Screen 4th Generation wRfx: NONREACTIVE

## 2024-01-07 LAB — RPR: RPR Ser Ql: NONREACTIVE

## 2024-01-07 LAB — HEPATITIS B SURFACE ANTIGEN: Hepatitis B Surface Ag: NEGATIVE

## 2024-01-09 ENCOUNTER — Encounter: Payer: Self-pay | Admitting: Licensed Clinical Social Worker

## 2024-01-12 ENCOUNTER — Ambulatory Visit: Payer: Self-pay | Admitting: Physician Assistant

## 2024-01-13 ENCOUNTER — Ambulatory Visit: Payer: Self-pay | Admitting: Licensed Clinical Social Worker

## 2024-01-13 ENCOUNTER — Other Ambulatory Visit: Payer: Self-pay

## 2024-01-13 LAB — CYTOLOGY - PAP
Comment: NEGATIVE
Comment: NEGATIVE
Comment: NEGATIVE
Diagnosis: UNDETERMINED — AB
HPV 16: NEGATIVE
HPV 18 / 45: NEGATIVE
High risk HPV: POSITIVE — AB

## 2024-01-13 MED ORDER — BORIC ACID CRYS: 600.0000 mg | CRYSTALS | Freq: Every day | 0 refills | Status: AC

## 2024-01-14 ENCOUNTER — Ambulatory Visit

## 2024-01-14 VITALS — BP 136/77 | HR 79 | Ht 65.0 in | Wt 337.0 lb

## 2024-01-14 DIAGNOSIS — Z809 Family history of malignant neoplasm, unspecified: Secondary | ICD-10-CM

## 2024-01-14 NOTE — Progress Notes (Cosign Needed Addendum)
 Empower salvia test done

## 2024-01-23 NOTE — BH Specialist Note (Signed)
 Patient no showed for today's visit.

## 2024-01-24 LAB — EMPOWER MULTI-CANCER (2 + 38): REPORT SUMMARY: NEGATIVE

## 2024-01-28 ENCOUNTER — Ambulatory Visit: Payer: Self-pay | Admitting: Obstetrics and Gynecology

## 2024-01-31 ENCOUNTER — Telehealth: Payer: Self-pay

## 2024-01-31 NOTE — Telephone Encounter (Signed)
 Received letter from pt insurance stating the need for medical records as empower testing was not covered. Sent medical records, insurance replied that they have determined the pt was not the best person to test for this and should have been a different relative (such as pt mom) to be tested and will not cover empower testing cost.

## 2024-05-20 NOTE — Progress Notes (Signed)
 Chief Complaint: Follow-up Primary GI MD: Dr. Stacia  HPI: 43 year old female history of anxiety, depression, diabetes, migraines, morbid obesity, presents for follow-up of diarrhea predominant IBS  01/2023: Virtual visit with Dr. Stacia in which she was given Bentyl  for abdominal pain and colestyramine for diarrhea given her history of cholecystectomy  04/2023: (Dr. Jerral improvement in symptoms although she was not adhering to medications.  Started on Elavil  and Metamucil in addition to cholestyramine  and Bentyl .  H. pylori test was positive and patient was treated with quadruple therapy with confirmed eradication.  Normal fecal elastase and lactoferrin.  Alpha gal panel is normal.  07/2023: (Dr. Stacia) Improved but persistent symptoms of diarrhea with urgency and abdominal pain, fatigue, nausea so Elavil  was increased to 20 mg daily and cholestyramine  increased to twice a day  10/2023: (Dr. Stacia) Patient not adhering to previously prescribed medication and having persistent symptoms.  Recommended to be consistent with increased cholestyramine  to twice daily, eliminate dairy, low FODMAP, increase Elavil  to 30 mg   Discussed the use of AI scribe software for clinical note transcription with the patient, who gave verbal consent to proceed.  History of Present Illness  Heidi Berry is a 43 year old female who presents for follow-up on her chronic diarrhea.  Her diarrhea fluctuates and is influenced by her diet and the amount of cholestyramine  powder she uses. She continues to use between one and two doses of cholestyramine  daily, depending on her symptoms. She is working on dietary changes, including hiring someone to assist with meal prepping to adhere to a low FODMAP diet and eliminate dairy.  She experiences gas and loose stools, although there is some improvement in stool consistency, describing it as 'a little muddy' but occasionally more solid. Diarrhea  and abdominal cramping still occur intermittently.  She has attempted to eliminate dairy from her diet and has switched to lactose-free milk, which she tolerates without needing to use the bathroom immediately after consumption. She acknowledges the challenges of completely eliminating dairy due to its presence in many foods.  She works from home and describes feeling weak and tired after episodes of diarrhea, attributing this to the physical exertion of bowel movements. She has discussed these symptoms with others who have similar conditions and found that they share similar experiences.  She has had one prior colonoscopy in 2005 in [place] .   Past Medical History:  Diagnosis Date   Anemia    Anxiety    BV (bacterial vaginosis)    Depression    DM (diabetes mellitus) (HCC)    Gallstones    Hypertension    IBS (irritable bowel syndrome)    Migraine    Obesity, morbid, BMI 50 or higher (HCC)    PFS (patellofemoral syndrome)    right    Past Surgical History:  Procedure Laterality Date   CHOLECYSTECTOMY     COLONOSCOPY  2005   HYDRADENITIS EXCISION Right 02/16/2021   Procedure: WIDE EXCISION HIDRADENITIS RIGHT BREAST;  Surgeon: Vernetta Berg, MD;  Location: [facility];  Service: General;  Laterality: Right;    Current Outpatient Medications  Medication Sig Dispense Refill   amitriptyline  (ELAVIL ) 10 MG tablet Take 3 tablets (30 mg total) by mouth at bedtime. 180 tablet 1   amLODipine (NORVASC) 5 MG tablet Take 1 tablet by mouth daily.     Boric Acid CRYS Place 600 mg vaginally at bedtime. Use vaginally every night for two weeks 500 g 0   cholestyramine  (QUESTRAN ) 4 g packet  Take 1 packet (4 g total) by mouth 2 (two) times daily. 120 each 3   dicyclomine  (BENTYL ) 20 MG tablet Take 1 tablet (20 mg total) by mouth every 6 (six) hours. 60 tablet 3   escitalopram (LEXAPRO) 10 MG tablet Take 10 mg by mouth daily.     famotidine  (PEPCID ) 20 MG tablet Take 20 mg by mouth daily.      ferrous sulfate 325 (65 FE) MG EC tablet Take 325 mg by mouth 3 (three) times daily with meals.     hydrochlorothiazide (HYDRODIURIL) 25 MG tablet Take 1 tablet by mouth every morning.     Hyoscyamine Sulfate SL (LEVSIN/SL) 0.125 MG SUBL Place 1 tablet (0.125 mg total) under the tongue in the morning, at noon, in the evening, and at bedtime. 120 tablet 11   levonorgestrel  (MIRENA ) 20 MCG/DAY IUD 1 each by Intrauterine route once.     losartan (COZAAR) 50 MG tablet Take 100 mg by mouth daily.     metFORMIN (GLUCOPHAGE-XR) 500 MG 24 hr tablet Take 500 mg by mouth in the morning and at bedtime.     Na Sulfate-K Sulfate-Mg Sulfate concentrate (SUPREP) 17.5-3.13-1.6 GM/177ML SOLN Take 1 kit (354 mLs total) by mouth once for 1 dose. 354 mL 0   Semaglutide, 2 MG/DOSE, (OZEMPIC, 2 MG/DOSE,) 8 MG/3ML SOPN Inject into the skin.     valACYclovir (VALTREX) 1000 MG tablet Take 500 mg by mouth 2 (two) times daily as needed (outbreak).     No current facility-administered medications for this visit.    Allergies as of 05/21/2024   (No Known Allergies)    Family History  Problem Relation Age of Onset   Diabetes Mother    Hypertension Mother    Congestive Heart Failure Mother    Ovarian cancer Mother    Kidney disease Mother    Diabetes Father    Hypertension Father    Kidney failure Father    Hypertension Sister    Diabetes Sister    Colon cancer Neg Hx    Esophageal cancer Neg Hx    Pancreatic cancer Neg Hx    Liver disease Neg Hx     Social History   Socioeconomic History   Marital status: Single    Spouse name: Not on file   Number of children: 1   Years of education: Not on file   Highest education level: Not on file  Occupational History   Occupation: health survey specialist  Tobacco Use   Smoking status: Some Days    Types: Cigars   Smokeless tobacco: Never  Vaping Use   Vaping status: Former   Quit date: 07/18/2020   Substances: Nicotine   Devices: Hooka  Substance and  Sexual Activity   Alcohol use: Yes    Alcohol/week: 0.0 standard drinks of alcohol    Comment: seldom   Drug use: No   Sexual activity: Yes    Partners: Male    Birth control/protection: I.U.D.  Other Topics Concern   Not on file  Social History Narrative   Not on file   Social Drivers of Health   Tobacco Use: High Risk (05/21/2024)   Patient History    Smoking Tobacco Use: Some Days    Smokeless Tobacco Use: Never    Passive Exposure: Not on file  Financial Resource Strain: Low Risk (09/06/2023)   Received from [facility]   Overall Financial Resource Strain (CARDIA)    Difficulty of Paying Living Expenses: Not very hard  Food  Insecurity: No Food Insecurity (09/06/2023)   Received from [facility]   Epic    Within the past 12 months, you worried that your food would run out before you got the money to buy more.: Never true    Within the past 12 months, the food you bought just didn't last and you didn't have money to get more.: Never true  Transportation Needs: No Transportation Needs (09/06/2023)   Received from [facility]   PRAPARE - Transportation    Lack of Transportation (Medical): No    Lack of Transportation (Non-Medical): No  Physical Activity: Unknown (08/07/2022)   Received from [facility]   Exercise Vital Sign    On average, how many days per week do you engage in moderate to strenuous exercise (like a brisk walk)?: 0 days    Minutes of Exercise per Session: Not on file  Stress: Stress Concern Present (08/07/2022)   Received from [facility] of Occupational Health - Occupational Stress Questionnaire    Feeling of Stress : Very much  Social Connections: Moderately Integrated (08/07/2022)   Received from [facility]   Social Network    How would you rate your social network (family, work, friends)?: Adequate participation with social networks  Intimate Partner Violence: Not At Risk (08/07/2022)   Received from [facility]   HITS     Over the last 12 months how often did your partner physically hurt you?: Never    Over the last 12 months how often did your partner insult you or talk down to you?: Never    Over the last 12 months how often did your partner threaten you with physical harm?: Never    Over the last 12 months how often did your partner scream or curse at you?: Never  Depression (PHQ2-9): Medium Risk (01/06/2024)   Depression (PHQ2-9)    PHQ-2 Score: 10  Alcohol Screen: Not on file  Housing: Low Risk (09/06/2023)   Received from [facility]    In the last 12 months, was there a time when you were not able to pay the mortgage or rent on time?: No    In the past 12 months, how many times have you moved where you were living?: 0    At any time in the past 12 months, were you homeless or living in a shelter (including now)?: No  Utilities: Not At Risk (09/06/2023)   Received from [facility] Utilities    Threatened with loss of utilities: No  Health Literacy: Not on file    Review of Systems:    Constitutional: No weight loss, fever, chills, weakness or fatigue HEENT: Eyes: No change in vision               Ears, Nose, Throat:  No change in hearing or congestion Skin: No rash or itching Cardiovascular: No chest pain, chest pressure or palpitations   Respiratory: No SOB or cough Gastrointestinal: See HPI and otherwise negative Genitourinary: No dysuria or change in urinary frequency Neurological: No headache, dizziness or syncope Musculoskeletal: No new muscle or joint pain Hematologic: No bleeding or bruising Psychiatric: No history of depression or anxiety    Physical Exam:  Vital signs: BP 138/72   Pulse 100   Ht 5' 5 (1.651 m)   Wt (!) 339 lb 4 oz (153.9 kg)   LMP  (LMP Unknown)   BMI 56.45 kg/m   Constitutional: NAD, alert and cooperative.  Morbidly obese. Head:  Normocephalic and atraumatic. Eyes:   PEERL, EOMI. No icterus. Conjunctiva pink. Respiratory: Respirations  even and unlabored. Lungs clear to auscultation bilaterally.   No wheezes, crackles, or rhonchi.  Cardiovascular:  Regular rate and rhythm. No peripheral edema, cyanosis or pallor.  Gastrointestinal:  Soft, nondistended, nontender. No rebound or guarding. Normal bowel sounds. No appreciable masses or hepatomegaly. Rectal:  Declines Msk:  Symmetrical without gross deformities. Without edema, no deformity or joint abnormality.  Neurologic:  Alert and  oriented x4;  grossly normal neurologically.  Skin:   Dry and intact without significant lesions or rashes. Psychiatric: Oriented to person, place and time. Demonstrates good judgement and reason without abnormal affect or behaviors.  Physical Exam    RELEVANT LABS AND IMAGING: CBC    Component Value Date/Time   WBC 7.5 05/03/2023 1507   RBC 4.07 05/03/2023 1507   HGB 11.1 (L) 05/03/2023 1507   HGB 11.2 05/29/2017 1413   HCT 34.1 (L) 05/03/2023 1507   HCT 33.0 (L) 05/29/2017 1413   PLT 453.0 (H) 05/03/2023 1507   PLT 383 (H) 05/29/2017 1413   MCV 83.8 05/03/2023 1507   MCV 79 05/29/2017 1413   MCH 26.0 02/07/2021 0831   MCHC 32.4 05/03/2023 1507   RDW 15.3 05/03/2023 1507   RDW 14.7 05/29/2017 1413   LYMPHSABS 3.1 05/03/2023 1507   MONOABS 0.7 05/03/2023 1507   EOSABS 0.1 05/03/2023 1507   BASOSABS 0.1 05/03/2023 1507    CMP     Component Value Date/Time   NA 138 02/07/2021 0831   NA 141 05/29/2017 1413   K 3.9 02/07/2021 0831   CL 105 02/07/2021 0831   CO2 22 02/07/2021 0831   GLUCOSE 160 (H) 02/07/2021 0831   BUN 11 02/07/2021 0831   BUN 9 05/29/2017 1413   CREATININE 0.81 02/07/2021 0831   CALCIUM 8.9 02/07/2021 0831   PROT 8.0 05/29/2017 1413   ALBUMIN 4.0 05/29/2017 1413   AST 20 05/29/2017 1413   ALT 17 05/29/2017 1413   ALKPHOS 89 05/29/2017 1413   BILITOT 0.2 05/29/2017 1413   GFRNONAA >60 02/07/2021 0831   GFRAA 109 05/29/2017 1413     Assessment/Plan:   IBS D IBS with diarrhea, urgency, gas,  fluctuating diet and medication adherence.  Negative lactoferrin and fecal calprotectin.  Negative alpha gal panel.  Currently on cholestyramine  twice daily (titrates this based on her bowels), Elavil  30 Mg, dairy elimination.  Not a candidate for Viberzi secondary to cholecystectomy status. struggles with FODMAP and dairy elimination. Some improvement from last visit but continued loose stools and pain.  Asking for FMLA to change from 1 to 4 hours to 1 to 8 hours. -- Continue colestyramine powder twice daily - Recommend strict adherence to FODMAP and dairy elimination, suspect this will help her symptoms if she is able to follow this diet.  She is planning to hire someone to meal prep. - Continue Elavil  30 mg - Add Levsin sublingual tablet as needed - Fill out FMLA until April 2026 -- Biopsies to rule out microscopic colitis during screening colonoscopy in February, added to hospital weight loss secondary to BMI  History of H. Pylori History of H. pylori treated with quadruple therapy with confirmed eradication  Colon cancer screening Patient will be 44 in February, she will be due for screening colonoscopy.  At this time we can obtain screening colonoscopy and also take biopsies to rule out microscopic colitis due to her longstanding history of diarrhea -  Schedule colonoscopy in the hospital setting secondary to BMI of 56 - I thoroughly discussed the procedure with the patient (at bedside) to include nature of the procedure, alternatives, benefits, and risks (including but not limited to bleeding, infection, perforation, anesthesia/cardiac pulmonary complications).  Patient verbalized understanding and gave verbal consent to proceed with procedure.   Nestor Mollie RIGGERS [facility] 05/21/2024, 11:37 AM  Cc: Rena Luke POUR, MD

## 2024-05-21 ENCOUNTER — Encounter: Payer: Self-pay | Admitting: Gastroenterology

## 2024-05-21 ENCOUNTER — Telehealth: Payer: Self-pay

## 2024-05-21 ENCOUNTER — Ambulatory Visit (INDEPENDENT_AMBULATORY_CARE_PROVIDER_SITE_OTHER): Admitting: Gastroenterology

## 2024-05-21 VITALS — BP 138/72 | HR 100 | Ht 65.0 in | Wt 339.2 lb

## 2024-05-21 DIAGNOSIS — Z8619 Personal history of other infectious and parasitic diseases: Secondary | ICD-10-CM

## 2024-05-21 DIAGNOSIS — Z1211 Encounter for screening for malignant neoplasm of colon: Secondary | ICD-10-CM

## 2024-05-21 DIAGNOSIS — K580 Irritable bowel syndrome with diarrhea: Secondary | ICD-10-CM | POA: Diagnosis not present

## 2024-05-21 MED ORDER — NA SULFATE-K SULFATE-MG SULF 17.5-3.13-1.6 GM/177ML PO SOLN: 1.0000 | Freq: Once | ORAL | 0 refills | Status: AC

## 2024-05-21 MED ORDER — HYOSCYAMINE SULFATE SL 0.125 MG SL SUBL: 1.0000 | SUBLINGUAL_TABLET | Freq: Four times a day (QID) | SUBLINGUAL | 11 refills | Status: AC

## 2024-05-21 NOTE — Patient Instructions (Addendum)
 We have sent the following medications to your pharmacy for you to pick up at your convenience:Suprep and Levsin.   You have been scheduled for a colonoscopy. Please follow written instructions given to you at your visit today.   If you use inhalers (even only as needed), please bring them with you on the day of your procedure.  DO NOT TAKE 7 DAYS PRIOR TO TEST- Trulicity (dulaglutide) Ozempic, Wegovy (semaglutide) Mounjaro, Zepbound (tirzepatide) Bydureon Bcise (exanatide extended release)  DO NOT TAKE 1 DAY PRIOR TO YOUR TEST Rybelsus (semaglutide) Adlyxin (lixisenatide) Victoza (liraglutide) Byetta (exanatide) _______________________________________________________________  _______________________________________________________  If your blood pressure at your visit was 140/90 or greater, please contact your primary care physician to follow up on this.  _______________________________________________________  If you are age 40 or older, your body mass index should be between 23-30. Your Body mass index is 56.45 kg/m. If this is out of the aforementioned range listed, please consider follow up with your Primary Care Provider.  If you are age 18 or younger, your body mass index should be between 19-25. Your Body mass index is 56.45 kg/m. If this is out of the aformentioned range listed, please consider follow up with your Primary Care Provider.   ________________________________________________________  The [facility] providers would like to encourage you to use MYCHART to communicate with providers for non-urgent requests or questions.  Due to long hold times on the telephone, sending your provider a message by [facility] may be a faster and more efficient way to get a response.  Please allow 48 business hours for a response.  Please remember that this is for non-urgent requests.  _______________________________________________________  [facility] is using a  team-based approach to care.  Your team is made up of your doctor and two to three APPS. Our APPS (Nurse Practitioners and Physician Assistants) work with your physician to ensure care continuity for you. They are fully qualified to address your health concerns and develop a treatment plan. They communicate directly with your gastroenterologist to care for you. Seeing the Advanced Practice Practitioners on your physician's team can help you by facilitating care more promptly, often allowing for earlier appointments, access to diagnostic testing, procedures, and other specialty referrals.

## 2024-05-21 NOTE — Telephone Encounter (Signed)
 Nestor Blower, PA filled out FMLA paperwork for patient at office visit today. Faxed FMLA paperwork to fax number on paperwork ([employer] LOA department). Received confirmation. Made a copy of paperwork. Had it scanned into Epic.   Left message for patient to return my call to see if she wants original paperwork.

## 2024-05-22 NOTE — Telephone Encounter (Signed)
 Patient returning call  Requesting a call back and states she will be free at 2pm-2:30pm Please advise  Thank you

## 2024-05-22 NOTE — Telephone Encounter (Signed)
 Patient states she would like the original mailed to her. FMLA paperwork mailed to home address per patient's request. Copy sent to me scanned into Epic.

## 2024-05-22 NOTE — Telephone Encounter (Signed)
 Left message for patient to return my call.

## 2024-05-31 NOTE — Progress Notes (Signed)
 Agree with the assessment and plan as outlined by Boone Master, PA-C.

## 2024-06-17 ENCOUNTER — Ambulatory Visit: Payer: Self-pay | Admitting: Licensed Clinical Social Worker

## 2024-06-17 DIAGNOSIS — F411 Generalized anxiety disorder: Secondary | ICD-10-CM

## 2024-06-17 DIAGNOSIS — F331 Major depressive disorder, recurrent, moderate: Secondary | ICD-10-CM

## 2024-06-17 DIAGNOSIS — F4381 Prolonged grief disorder: Secondary | ICD-10-CM

## 2024-06-17 NOTE — BH Specialist Note (Signed)
 "   Integrated Behavioral Health via Telemedicine Visit  06/20/2024 Heidi Berry [id number]  Number of Integrated Behavioral Health Clinician visits: 1- Initial Visit  Session Start time: 1515   Session End time: 1621  Total time in minutes: 07-18-2064    Referring Provider:  Patient/Family location: Home [facility] Provider location: Remote Office All persons participating in visit: Patient and [facility] Types of Service: Individual psychotherapy and Video visit  I connected with Heidi Berry and/or Heidi Berry's patient via  Telephone or [employer]  (Video is Caregility application) and verified that I am speaking with the correct person using two identifiers. Discussed confidentiality: Yes   I discussed the limitations of telemedicine and the availability of in person appointments.  Discussed there is a possibility of technology failure and discussed alternative modes of communication if that failure occurs.  I discussed that engaging in this telemedicine visit, they consent to the provision of behavioral healthcare and the services will be billed under their insurance.  Patient and/or legal guardian expressed understanding and consented to Telemedicine visit: Yes   Presenting Concerns: Patient and/or family reports the following symptoms/concerns: increased anxiety and depressive symptoms.  Duration of problem: Months; Severity of problem: moderate  Patient and/or Family's Strengths/Protective Factors: Social and Emotional competence, Concrete supports in place (healthy food, safe environments, etc.), and Physical Health (exercise, healthy diet, medication compliance, etc.)  Goals Addressed: Patient will:  Reduce symptoms of: anxiety and depression   Increase knowledge and/or ability of: coping skills, healthy habits, and self-management skills   Demonstrate ability to: Increase healthy adjustment to current life circumstances and Increase adequate support  systems for patient/family  Progress towards Goals: Ongoing    Interventions: Interventions utilized:  Mindfulness or Relaxation Training, Supportive Counseling, Psychoeducation and/or Health Education, and Supportive Reflection Standardized Assessments completed: Not Needed    Patient and/or Family Response: The patient was present for todays virtual session. She reported a history of Major Depressive Disorder and anxiety and is currently prescribed escitalopram (Lexapro). She endorsed significant sleep disturbance characterized by racing thoughts and fear of failure, requiring 30 minutes to two hours to fall asleep, which negatively impacts daytime functioning. She reported relying on coffee to compensate for fatigue despite having high blood pressure. The patient stated melatonin has been ineffective. She described episodic anxiety with sudden onset, accompanied by low mood, crying spells, and difficulty maintaining focus. She also endorsed decreased motivation and loss of interest in previously enjoyed activities such as painting, crafts, and crocheting. Additionally, the patient reported compulsive behaviors including repeatedly tapping light switches,  aligning shoes, and needing to complete tasks starting on the right side.  The patient continues to experience unresolved grief related to the loss of both parents, reporting that her father passed away in 47 and was buried on her birthday, and her mother passed away in 07/19/2019 the day before her sisters birthday. She stated difficulty allowing herself to fully grieve her mothers death but plans to attend a grief group in March. Interventions during session included psychoeducation on anxiety management, encouraging scheduled worry time, journaling intrusive thoughts, and use of guided meditation before bedtime to reduce nighttime anxiety and improve sleep.   Clinical Assessment/Diagnosis    Moderate episode of recurrent major depressive  disorder (HCC)  Generalized anxiety disorder  Prolonged grief disorder    Assessment: Patient currently experiencing increased symptoms of depression and anxiety, including sleep disturbance, racing thoughts, episodic crying, difficulty concentrating, and decreased motivation. She is also reporting compulsive behaviors and unresolved  grief, which are contributing to emotional distress and impaired daily functioning..   Patient may benefit from continued support of integrated behavioral health services.  Plan: Follow up with behavioral health clinician on : 06/24/2024 Behavioral recommendations: patient to continue with individual therapy to address depressive, anxiety, and grief-related symptoms while strengthening coping and emotional regulation skills. She is encouraged to practice scheduled worry time, journaling, and relaxation techniques before bedtime, and to follow up with her prescribing provider regarding ongoing medication and sleep concerns. Referral(s): Integrated [employer] (In Clinic)  I discussed the assessment and treatment plan with the patient and/or parent/guardian. They were provided an opportunity to ask questions and all were answered. They agreed with the plan and demonstrated an understanding of the instructions.   They were advised to call back or seek an in-person evaluation if the symptoms worsen or if the condition fails to improve as anticipated.  Kishana Battey LITTIE Seats, LCSWA "

## 2024-06-24 ENCOUNTER — Encounter: Payer: Self-pay | Admitting: Licensed Clinical Social Worker

## 2024-07-06 ENCOUNTER — Ambulatory Visit: Payer: Self-pay | Admitting: Licensed Clinical Social Worker

## 2024-07-06 DIAGNOSIS — F331 Major depressive disorder, recurrent, moderate: Secondary | ICD-10-CM

## 2024-07-06 NOTE — BH Specialist Note (Unsigned)
 "   Integrated Behavioral Health via Telemedicine Visit  07/15/2024 Heidi Berry [id number]  Number of Integrated Behavioral Health Clinician visits: 2- Second Visit  Session Start time: 1415   Session End time: 1445  Total time in minutes: 30    Referring Provider:  Patient/Family location: Home [facility] Provider location: Remote Office All persons participating in visit: Patient and [facility] Types of Service: Individual psychotherapy and Video visit  I connected with Heidi Berry and/or Heidi Berry's patient via  Telephone or [employer]  (Video is Caregility application) and verified that I am speaking with the correct person using two identifiers. Discussed confidentiality: Yes   I discussed the limitations of telemedicine and the availability of in person appointments.  Discussed there is a possibility of technology failure and discussed alternative modes of communication if that failure occurs.  I discussed that engaging in this telemedicine visit, they consent to the provision of behavioral healthcare and the services will be billed under their insurance.  Patient and/or legal guardian expressed understanding and consented to Telemedicine visit: Yes   Presenting Concerns: Patient and/or family reports the following symptoms/concerns: Improvements with mood and symptoms.  Duration of problem: Months; Severity of problem: moderate  Patient and/or Family's Strengths/Protective Factors: Social connections, Social and Emotional competence, Concrete supports in place (healthy food, safe environments, etc.), and Physical Health (exercise, healthy diet, medication compliance, etc.)  Goals Addressed: Patient will:  Reduce symptoms of: anxiety and depression   Increase knowledge and/or ability of: coping skills, healthy habits, and self-management skills   Demonstrate ability to: Increase healthy adjustment to current life circumstances and Increase  adequate support systems for patient/family  Progress towards Goals: Ongoing    Interventions: Interventions utilized:  Mindfulness or Management Consultant, Supportive Counseling, Psychoeducation and/or Health Education, Communication Skills, and Supportive Reflection Standardized Assessments completed: Not Needed    Patient and/or Family Response: Patient was present for todays virtual session. She reports increased engagement in journaling, completing writing activities primarily before bedtime (outside of bed), and describes the process as helpful in releasing and resolving thoughts and emotions. Although journaling has not been fully consistent, she reports initiation of the habit. Patient also reports participation in creative activities, including art projects with a friend, and is exploring new hobbies such as painting, noting she is still in the early stages. She reports starting new projects, setting goals, waking earlier, increasing water intake, and receiving support and accountability from friends. Patient verbalized understanding of sleep hygiene recommendations and agreed to implement earlier bedtimes and limit phone use for at least 30-45 minutes prior to sleep.  Clinical Assessment/Diagnosis  Moderate episode of recurrent major depressive disorder (HCC)    Assessment: Patient currently experiencing increased motivation, improved engagement in creative and self-care activities, and a growing sense of structure and support. She reports early habit formation and goal setting while continuing to explore new hobbies and routines..   Patient may benefit from continued support of integrated behavioral health services.  Plan: Follow up with behavioral health clinician on : 07/17/2024 Behavioral recommendations: Patient to continue journaling and engaging in creative activities to support emotional expression and routine building. She is advised to maintain sleep hygiene practices,  hydration goals, and utilize social supports for accountability and ongoing motivation. Referral(s): Integrated [employer] (In Clinic)  I discussed the assessment and treatment plan with the patient and/or parent/guardian. They were provided an opportunity to ask questions and all were answered. They agreed with the plan and demonstrated an understanding  of the instructions.   They were advised to call back or seek an in-person evaluation if the symptoms worsen or if the condition fails to improve as anticipated.  Heidi Berry LITTIE Seats, LCSWA "

## 2024-07-15 ENCOUNTER — Encounter (HOSPITAL_COMMUNITY): Payer: Self-pay | Admitting: Gastroenterology

## 2024-07-17 ENCOUNTER — Ambulatory Visit: Payer: Self-pay | Admitting: Licensed Clinical Social Worker

## 2024-07-17 DIAGNOSIS — F411 Generalized anxiety disorder: Secondary | ICD-10-CM

## 2024-07-17 DIAGNOSIS — F331 Major depressive disorder, recurrent, moderate: Secondary | ICD-10-CM

## 2024-07-17 DIAGNOSIS — F4381 Prolonged grief disorder: Secondary | ICD-10-CM

## 2024-07-17 NOTE — BH Specialist Note (Signed)
 "   Integrated Behavioral Health via Telemedicine Visit  07/17/2024 Heidi Berry [id number]  Number of Integrated Behavioral Health Clinician visits: 3- Third Visit  Session Start time: 0915   Session End time: 0940  Total time in minutes: 25    Referring Provider:  Patient/Family location: Home [facility] Provider location: Remote Office All persons participating in visit: Patient and [facility] Types of Service: Individual psychotherapy and Video visit  I connected with Heidi Berry and/or Heidi Berry's patient via  Telephone or [employer]  (Video is Caregility application) and verified that I am speaking with the correct person using two identifiers. Discussed confidentiality: Yes   I discussed the limitations of telemedicine and the availability of in person appointments.  Discussed there is a possibility of technology failure and discussed alternative modes of communication if that failure occurs.  I discussed that engaging in this telemedicine visit, they consent to the provision of behavioral healthcare and the services will be billed under their insurance.  Patient and/or legal guardian expressed understanding and consented to Telemedicine visit: Yes   Presenting Concerns: Patient and/or family reports the following symptoms/concerns: Improvements with mood symptoms.  Duration of problem: Months; Severity of problem: moderate  Patient and/or Family's Strengths/Protective Factors: Social connections, Social and Emotional competence, Concrete supports in place (healthy food, safe environments, etc.), and Physical Health (exercise, healthy diet, medication compliance, etc.)  Goals Addressed: Patient will:  Reduce symptoms of: anxiety and depression   Increase knowledge and/or ability of: coping skills, healthy habits, and self-management skills   Demonstrate ability to: Increase healthy adjustment to current life circumstances and Increase adequate  support systems for patient/family  Progress towards Goals: Ongoing    Interventions: Interventions utilized:  Mindfulness or Management Consultant, Supportive Counseling, Psychoeducation and/or Health Education, Communication Skills, and Supportive Reflection Standardized Assessments completed: Not Needed    Patient and/or Family Response: Patient was present for todays virtual session. She expressed excitement about her upcoming birthday, reporting feelings of being overwhelmed in a positive and celebratory way. She shared plans for self-care activities, including getting a forensic scientist and pedicure with a friend, attending her therapy appointment, and participating in a family meeting later in the day. Patient also reported upcoming plans to stay at the [place] for a spa day, dinner, and time with friends, as well as visiting her best friend in [place] . She reflected that these experiences feel affirming and meaningful, noting that she is intentionally pouring into herself rather than overcompensating for the needs of others, which she identified as a common pattern in her relationships.   Clinical Assessment/Diagnosis  Moderate episode of recurrent major depressive disorder (HCC)  Generalized anxiety disorder  Prolonged grief disorder    Assessment: Patient currently experiencing positive emotional activation, excitement, and increased self-awareness regarding her need for self-care and balance. She presents with improved mood and a growing sense of self-prioritization.   Patient may benefit from continued support of integrated behavioral health services.  Plan: Follow up with behavioral health clinician on : 07/27/2024 Behavioral recommendations: Patient is encouraged to continue engaging in intentional self-care and maintain awareness of personal boundaries to prevent burnout. Continued reflection on balancing giving to others with meeting her own emotional needs is  recommended to support ongoing well-being. Referral(s): Integrated [employer] (In Clinic)  I discussed the assessment and treatment plan with the patient and/or parent/guardian. They were provided an opportunity to ask questions and all were answered. They agreed with the plan and demonstrated an understanding of  the instructions.   They were advised to call back or seek an in-person evaluation if the symptoms worsen or if the condition fails to improve as anticipated.  Heidi Berry, LCSWA "

## 2024-07-27 ENCOUNTER — Encounter: Payer: Self-pay | Admitting: Licensed Clinical Social Worker

## 2024-07-28 ENCOUNTER — Ambulatory Visit (HOSPITAL_COMMUNITY): Admit: 2024-07-28 | Admitting: Gastroenterology

## 2024-07-28 ENCOUNTER — Encounter (HOSPITAL_COMMUNITY): Payer: Self-pay
